# Patient Record
Sex: Female | Born: 1955 | Race: White | Hispanic: No
Health system: Midwestern US, Academic
[De-identification: ages and names within clinical notes are randomized; demographics above are authoritative.]

## PROBLEM LIST (undated history)

## (undated) DIAGNOSIS — Z1211 Encounter for screening for malignant neoplasm of colon: Secondary | ICD-10-CM

## (undated) DIAGNOSIS — Z1231 Encounter for screening mammogram for malignant neoplasm of breast: Secondary | ICD-10-CM

## (undated) DIAGNOSIS — Z87891 Personal history of nicotine dependence: Secondary | ICD-10-CM

## (undated) DIAGNOSIS — R928 Other abnormal and inconclusive findings on diagnostic imaging of breast: Secondary | ICD-10-CM

## (undated) DIAGNOSIS — E119 Type 2 diabetes mellitus without complications: Secondary | ICD-10-CM

## (undated) DIAGNOSIS — J449 Chronic obstructive pulmonary disease, unspecified: Secondary | ICD-10-CM

## (undated) DIAGNOSIS — C189 Malignant neoplasm of colon, unspecified: Secondary | ICD-10-CM

## (undated) DIAGNOSIS — A048 Other specified bacterial intestinal infections: Secondary | ICD-10-CM

## (undated) DIAGNOSIS — K2940 Chronic atrophic gastritis without bleeding: Secondary | ICD-10-CM

## (undated) DIAGNOSIS — M25559 Pain in unspecified hip: Secondary | ICD-10-CM

## (undated) MED ORDER — HYDROCODONE-ACETAMINOPHEN 5 MG-325 MG TAB
5-325 mg | ORAL_TABLET | ORAL | Status: DC | PRN
Start: ? — End: 2015-01-18

## (undated) MED ORDER — PEG 3350-ELECTROLYTES ORAL SOLUTION
Freq: Once | ORAL | Status: AC
Start: ? — End: 2012-12-22

## (undated) MED ORDER — CIPROFLOXACIN 500 MG TAB
500 mg | ORAL_TABLET | Freq: Two times a day (BID) | ORAL | Status: AC
Start: ? — End: 2013-01-02

## (undated) MED ORDER — BISACODYL-PEG-ELECTROLYTE SOLN 5 MG-210 GRAM ORAL KIT
5-210 mg-gram | PACK | Freq: Once | ORAL | Status: AC
Start: ? — End: 2012-12-20

## (undated) MED ORDER — DOCUSATE SODIUM 100 MG CAP: 100 mg | ORAL_CAPSULE | Freq: Two times a day (BID) | ORAL | Status: AC

---

## 2009-06-25 MED ORDER — DIAZEPAM 5 MG TAB
5 mg | ORAL_TABLET | Freq: Three times a day (TID) | ORAL | Status: DC | PRN
Start: 2009-06-25 — End: 2010-09-16

## 2009-06-25 MED ORDER — IBUPROFEN 600 MG TAB
600 mg | ORAL_TABLET | Freq: Four times a day (QID) | ORAL | Status: DC | PRN
Start: 2009-06-25 — End: 2010-09-16

## 2009-06-25 MED ORDER — HYDROCODONE-ACETAMINOPHEN 5 MG-500 MG TAB
5-500 mg | ORAL_TABLET | ORAL | Status: AC | PRN
Start: 2009-06-25 — End: 2009-07-02

## 2009-06-25 NOTE — ED Notes (Signed)
Patient noted back in room from xray with c-collar off.
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
## 2009-06-25 NOTE — ED Notes (Signed)
No head injury no loc. Received on backboard and collar
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
## 2009-10-15 NOTE — ED Notes (Signed)
Pt ambulatory to bathroom without problem.

## 2009-10-15 NOTE — ED Notes (Signed)
Pt presents via ems c/o Left lower arm "stiffness", intermittently for the past few months.  Pt states it happened once this morning, but denies pain or arm stiffness at this time.  Pt has full ROM noted to her L arm, skin is warm, dry and intact, brisk cap refill, bounding radial pulses.

## 2009-10-15 NOTE — ED Notes (Signed)
Assumed care of pt. Pt positioned for comfort, call bell given.  Plan of care discussed with pt.

## 2009-10-15 NOTE — ED Notes (Signed)
Pt is calling her son to come get her.

## 2010-09-16 LAB — CBC WITH AUTOMATED DIFF
ABS. BASOPHILS: 0.1 10*3/uL (ref 0.0–0.1)
ABS. EOSINOPHILS: 0.1 10*3/uL (ref 0.0–0.4)
ABS. LYMPHOCYTES: 2 10*3/uL (ref 0.8–3.5)
ABS. MONOCYTES: 0.9 10*3/uL (ref 0.0–1.0)
ABS. NEUTROPHILS: 8 10*3/uL (ref 1.8–8.0)
BASOPHILS: 1 % (ref 0–1)
EOSINOPHILS: 1 % (ref 0–7)
HCT: 38.4 % (ref 35.0–47.0)
HGB: 12.6 g/dL (ref 11.5–16.0)
LYMPHOCYTES: 18 % (ref 12–49)
MCH: 30.1 PG (ref 26.0–34.0)
MCHC: 32.8 g/dL (ref 30.0–36.5)
MCV: 91.9 FL (ref 80.0–99.0)
MONOCYTES: 8 % (ref 5–13)
NEUTROPHILS: 72 % (ref 32–75)
PLATELET: 243 10*3/uL (ref 150–400)
RBC: 4.18 M/uL (ref 3.80–5.20)
RDW: 12.6 % (ref 11.5–14.5)
WBC: 11.1 10*3/uL — ABNORMAL HIGH (ref 3.6–11.0)

## 2010-09-16 LAB — METABOLIC PANEL, COMPREHENSIVE
A-G Ratio: 0.9 — ABNORMAL LOW (ref 1.1–2.2)
ALT (SGPT): 42 U/L (ref 12–78)
AST (SGOT): 20 U/L (ref 15–37)
Albumin: 3.4 g/dL — ABNORMAL LOW (ref 3.5–5.0)
Alk. phosphatase: 99 U/L (ref 50–136)
Anion gap: 3 mmol/L — ABNORMAL LOW (ref 5–15)
BUN/Creatinine ratio: 13 (ref 12–20)
BUN: 8 MG/DL (ref 6–20)
Bilirubin, total: 0.2 MG/DL (ref 0.2–1.0)
CO2: 35 MMOL/L — ABNORMAL HIGH (ref 21–32)
Calcium: 9.6 MG/DL (ref 8.5–10.1)
Chloride: 100 MMOL/L (ref 97–108)
Creatinine: 0.6 MG/DL (ref 0.6–1.3)
GFR est AA: >60 mL/min/{1.73_m2} (ref >60)
GFR est non-AA: >60 mL/min/{1.73_m2} (ref >60)
Globulin: 3.9 g/dL (ref 2.0–4.0)
Glucose: 102 MG/DL — ABNORMAL HIGH (ref 65–100)
Potassium: 3.6 MMOL/L (ref 3.5–5.1)
Protein, total: 7.3 g/dL (ref 6.4–8.2)
Sodium: 138 MMOL/L (ref 136–145)

## 2010-09-16 LAB — EKG, 12 LEAD, INITIAL
Atrial Rate: 62 {beats}/min
Calculated P Axis: 82 degrees
Calculated R Axis: -56 degrees
Calculated T Axis: 47 degrees
Diagnosis: NORMAL
P-R Interval: 142 ms
Q-T Interval: 436 ms
QRS Duration: 76 ms
QTC Calculation (Bezet): 442 ms
Ventricular Rate: 62 {beats}/min

## 2010-09-16 LAB — TROPONIN I: Troponin-I, Qt.: <0.04 ng/mL (ref <0.05)

## 2010-09-16 LAB — CK W/ CKMB & INDEX
CK - MB: 4.7 NG/ML — ABNORMAL HIGH (ref 0.5–3.6)
CK-MB Index: 4.8 — ABNORMAL HIGH (ref 0–2.5)
CK: 98 U/L (ref 26–192)

## 2010-09-16 LAB — NT-PRO BNP: NT pro-BNP: 115 PG/ML (ref 0–125)

## 2010-09-16 MED ORDER — IPRATROPIUM BROMIDE 0.02 % SOLN FOR INHALATION
0.02 % | RESPIRATORY_TRACT | Status: AC
Start: 2010-09-16 — End: 2010-09-16
  Administered 2010-09-16: 19:00:00 via RESPIRATORY_TRACT

## 2010-09-16 MED ORDER — ALBUTEROL SULFATE 0.083 % (0.83 MG/ML) SOLN FOR INHALATION
2.5 mg /3 mL (0.083 %) | RESPIRATORY_TRACT | Status: AC
Start: 2010-09-16 — End: 2010-09-16
  Administered 2010-09-16: 20:00:00 via RESPIRATORY_TRACT

## 2010-09-16 MED ORDER — AZITHROMYCIN 250 MG TAB
250 mg | PACK | ORAL | Status: AC
Start: 2010-09-16 — End: 2010-09-21

## 2010-09-16 MED ORDER — ALBUTEROL 90 MCG/ACTUATION AEROSOL INHALER
90 mcg/actuation | RESPIRATORY_TRACT | Status: DC | PRN
Start: 2010-09-16 — End: 2012-08-25

## 2010-09-16 MED ORDER — ALBUTEROL SULFATE 0.083 % (0.83 MG/ML) SOLN FOR INHALATION
2.5 mg /3 mL (0.083 %) | RESPIRATORY_TRACT | Status: AC
Start: 2010-09-16 — End: 2010-09-16
  Administered 2010-09-16: 19:00:00 via RESPIRATORY_TRACT

## 2010-09-16 NOTE — ED Notes (Signed)
Pt lives independently  with her son. Was at a group center for daily activities when her left hand became numb, approx 2 hours ago.  Good strength bilaterally, no neuro deficits noted.   Productive cough with +wheezing for 1 month.

## 2010-09-16 NOTE — ED Provider Notes (Signed)
HPI Comments: 55 y.o. WF, with PMHx significant for schizophrenia, arrives via EMS to the ED with C/O L hand numbness and dry cough. Pt lives independently with her son and goes to a group center daily. Pt reports L hand numbness and tingling, while she was at her group center today PTA. Her caregivers at the group center thought the pt might have had a possible stroke. Pt also reports non-productive cough with associated wheezing x "couple months". Pt states her cough is worse at night. Pt notes that she has taken cough medication with some relief. Pt also reports generalized HA. Pt denies CP, N/V/D, shoulder pain, elbow pain, or change in her appetite. Pt notes she had a cardiac stent placed approximately 5 years ago. Pt denies PMHx significant for DM or asthma.     ZOX:WRUE P KOWALSKI, MD    Allergies: NKDA  PMHx significant for: HTN, CAD     There are no other complaints, changes or physical findings at this time.    Written by Talmadge Coventry. Pandak, ED Scribe, as dictated by PA-C Drinda Butts.     The history is provided by the patient.        Past Medical History   Diagnosis Date   ??? Hypertension    ??? CAD (coronary artery disease)    ??? Psychiatric disorder      schizophrenia        Past Surgical History   Procedure Date   ??? Cardiac surg procedure unlist          No family history on file.     History     Social History   ??? Marital Status: Single     Spouse Name: N/A     Number of Children: N/A   ??? Years of Education: N/A     Occupational History   ??? Not on file.     Social History Main Topics   ??? Smoking status: Passive Smoker   ??? Smokeless tobacco: Not on file   ??? Alcohol Use: No   ??? Drug Use: No   ??? Sexually Active: No     Other Topics Concern   ??? Not on file     Social History Narrative   ??? No narrative on file                  ALLERGIES: Review of patient's allergies indicates no known allergies.      Review of Systems   Constitutional: Negative.  Negative for appetite change.   Eyes: Negative.     Respiratory: Positive for cough and wheezing.    Cardiovascular: Negative.  Negative for chest pain.   Gastrointestinal: Negative.  Negative for nausea, vomiting and diarrhea.   Genitourinary: Negative.    Musculoskeletal: Negative.  Negative for arthralgias.   Skin: Negative.    Neurological: Positive for numbness (L hand) and headaches.   [all other systems reviewed and are negative        Filed Vitals:    09/16/10 1318 09/16/10 1420 09/16/10 1447 09/16/10 1456   BP: 135/102 132/73 132/73 125/72   Pulse: 67 62 63 64   Temp: 98.7 ??F (37.1 ??C)      Resp: 18 16  18    Height: 5\' 4"  (1.626 m)      Weight: 170 lb (77.111 kg)      SpO2: 96% 93%  94%            Physical Exam   [nursing notereviewed.  Constitutional: She is oriented to person, place, and time. She appears well-developed and well-nourished.   HENT:   Head: Normocephalic and atraumatic.   Right Ear: External ear normal.   Left Ear: External ear normal.   Nose: Nose normal.   Mouth/Throat: Oropharynx is clear and moist.   Eyes: Conjunctivae and EOM are normal. Pupils are equal, round, and reactive to light.   Neck: Normal range of motion. Neck supple.   Cardiovascular: Normal rate, regular rhythm and normal heart sounds.  Exam reveals no gallop and no friction rub.    No murmur heard.  Pulmonary/Chest: Effort normal. No respiratory distress. She has wheezes. She has no rales. She exhibits no tenderness.   Abdominal: Soft. Bowel sounds are normal. There is no tenderness. There is no rebound and no guarding.   Musculoskeletal: Normal range of motion. She exhibits no edema and no tenderness.   Neurological: She is alert and oriented to person, place, and time. She has normal reflexes. No cranial nerve deficit. Coordination normal.        Grips equal.  Upper and lower extremity strength intact.    Skin: Skin is warm and dry.   Psychiatric:        Slightly odd affect.   Behavior appropriate.  Answers questions and cooperates with exam.          MDM      Amount and/or Complexity of Data Reviewed:   Clinical lab tests:  [ordered and reviewed  Tests in the radiology section of CPT??:  [reviewed and ordered   Decide to obtain previous medical records or to obtain history from someone other than the patient:  No   Review and summarize past medical records:  Yes  Progress:   Patient progress:  Mason Jim      Procedures    4:05 PM  Pt has been reexamined by PA-C Drinda Butts. Pt has no new complaints, changes, or physical findings. Care plan outlined and precautions discussed. All available results were reviewed with pt. All medications were reviewed with pt. All of pt???s questions and concerns were addressed. Pt agrees to F/U as instructed by PA-C Drinda Butts and agrees to return to ED upon further deterioration. Pt is ready to go home.  Written by Talmadge Coventry. Pandak, ED Scribe, as dictated by .PA-C Drinda Butts.     ED EKG interpretation:  Rhythm: normal sinus rhythm; and regular . Rate (approx.): 62; Axis: left axis deviation; P wave: normal; QRS interval: normal ; ST/T wave: non-specific changes;  This EKG was interpreted by Nicki Reaper, MD,ED Provider.      I personally saw and examined the patient.  I have reviewed and agree with the MLP's findings, including all diagnostic interpretations, and plans as written.   I was present during the key portions of separately billed procedures.    Nicki Reaper, MD

## 2010-09-16 NOTE — Progress Notes (Signed)
MD at bedside for discharge home, pt with visitors verbalized understanding of need for f/u. Pt ambulatory with steady gait. NAD.

## 2011-06-23 LAB — CBC WITH AUTOMATED DIFF
ABS. BASOPHILS: 0 10*3/uL (ref 0.0–0.1)
ABS. EOSINOPHILS: 0.1 10*3/uL (ref 0.0–0.4)
ABS. LYMPHOCYTES: 1.5 10*3/uL (ref 0.8–3.5)
ABS. MONOCYTES: 0.5 10*3/uL (ref 0.0–1.0)
ABS. NEUTROPHILS: 6.3 10*3/uL (ref 1.8–8.0)
BASOPHILS: 0 % (ref 0–1)
EOSINOPHILS: 1 % (ref 0–7)
HCT: 41.9 % (ref 35.0–47.0)
HGB: 13.9 g/dL (ref 11.5–16.0)
LYMPHOCYTES: 18 % (ref 12–49)
MCH: 29.8 PG (ref 26.0–34.0)
MCHC: 33.2 g/dL (ref 30.0–36.5)
MCV: 89.7 FL (ref 80.0–99.0)
MONOCYTES: 6 % (ref 5–13)
NEUTROPHILS: 75 % (ref 32–75)
PLATELET: 262 10*3/uL (ref 150–400)
RBC: 4.67 M/uL (ref 3.80–5.20)
RDW: 12.6 % (ref 11.5–14.5)
WBC: 8.4 10*3/uL (ref 3.6–11.0)

## 2011-06-23 LAB — METABOLIC PANEL, COMPREHENSIVE
A-G Ratio: 1.2 (ref 1.1–2.2)
ALT (SGPT): 23 U/L (ref 12–78)
AST (SGOT): 19 U/L (ref 15–37)
Albumin: 3.9 g/dL (ref 3.5–5.0)
Alk. phosphatase: 59 U/L (ref 50–136)
Anion gap: 2 mmol/L — ABNORMAL LOW (ref 5–15)
BUN/Creatinine ratio: 30 — ABNORMAL HIGH (ref 12–20)
BUN: 14 MG/DL (ref 6–20)
Bilirubin, total: 0.3 MG/DL (ref 0.2–1.0)
CO2: 37 MMOL/L — ABNORMAL HIGH (ref 21–32)
Calcium: 9.1 MG/DL (ref 8.5–10.1)
Chloride: 99 MMOL/L (ref 97–108)
Creatinine: 0.46 MG/DL (ref 0.45–1.15)
GFR est AA: >60 mL/min/{1.73_m2} (ref >60)
GFR est non-AA: >60 mL/min/{1.73_m2} (ref >60)
Globulin: 3.2 g/dL (ref 2.0–4.0)
Glucose: 86 MG/DL (ref 65–100)
Potassium: 4.1 MMOL/L (ref 3.5–5.1)
Protein, total: 7.1 g/dL (ref 6.4–8.2)
Sodium: 138 MMOL/L (ref 136–145)

## 2011-06-23 LAB — EKG, 12 LEAD, INITIAL
Atrial Rate: 53 {beats}/min
Calculated P Axis: 73 degrees
Calculated R Axis: -60 degrees
Calculated T Axis: 69 degrees
P-R Interval: 152 ms
Q-T Interval: 456 ms
QRS Duration: 76 ms
QTC Calculation (Bezet): 427 ms
Ventricular Rate: 53 {beats}/min

## 2011-06-23 LAB — CK W/ CKMB & INDEX
CK - MB: 2.4 NG/ML (ref 0.5–3.6)
CK - MB: 2.5 NG/ML (ref 0.5–3.6)
CK-MB Index: 3.5 — ABNORMAL HIGH (ref 0–2.5)
CK-MB Index: 4 — ABNORMAL HIGH (ref 0–2.5)
CK: 63 U/L (ref 26–192)
CK: 69 U/L (ref 26–192)

## 2011-06-23 LAB — TROPONIN I
Troponin-I, Qt.: <0.04 ng/mL (ref <0.05)
Troponin-I, Qt.: <0.04 ng/mL (ref <0.05)

## 2011-06-23 NOTE — ED Notes (Signed)
Diet pepsi given to pt. Will redraw cardiac labs. Pt sleeps off and on. Pt aware at this time of having to recheck labs

## 2011-06-23 NOTE — ED Provider Notes (Signed)
HPI Comments: 56 y.o. female presents via EMS to ED C/O R sided CP x 1230 today. Pt describes her CP as a dull ache, which she states is non-radiating. Pt states her pain resolved 5 minutes PTA, after taking a 325 mg ASA en route. Pt reports a Hx of an MI, after which she had stents placed 6 years ago. Pt denies any CP with exertion recently, or exacerbating factors which worsen or improve her Sx's. Pt denies any SOB, ABD pain, N/V/D, F/C, cough or rash.    PCP: Evalee Jefferson, MD  PMHx significant for: HTN, CAD, asthma  PSHx significant for: cardiac cath with stent placement  Social Hx: + tobacco     There are no other complaints, changes or physical findings at this time. 3:07 PM  Written by Ena Dawley, ED Scribe; Dictated by Tessie Fass, MD.    The history is provided by the patient.        Past Medical History   Diagnosis Date   ??? Hypertension    ??? CAD (coronary artery disease)    ??? Psychiatric disorder      schizophrenia        Past Surgical History   Procedure Date   ??? Pr cardiac surg procedure unlist          No family history on file.     History     Social History   ??? Marital Status: SINGLE     Spouse Name: N/A     Number of Children: N/A   ??? Years of Education: N/A     Occupational History   ??? Not on file.     Social History Main Topics   ??? Smoking status: Passive Smoker   ??? Smokeless tobacco: Not on file   ??? Alcohol Use: No   ??? Drug Use: No   ??? Sexually Active: No     Other Topics Concern   ??? Not on file     Social History Narrative   ??? No narrative on file                  ALLERGIES: Review of patient's allergies indicates no known allergies.      Review of Systems   Constitutional: Negative.  Negative for fever and chills.   HENT: Negative.    Eyes: Negative.    Respiratory: Negative.  Negative for cough and shortness of breath.    Cardiovascular: Positive for chest pain.   Gastrointestinal: Negative.  Negative for nausea, vomiting, abdominal pain and diarrhea.   Genitourinary: Negative.     Musculoskeletal: Negative.    Skin: Negative.  Negative for rash.   Neurological: Negative.    All other systems reviewed and are negative.        Filed Vitals:    06/23/11 1400 06/23/11 1415 06/23/11 1430 06/23/11 1445   BP: 130/66 129/107 127/60 142/84   Pulse: 59 58 57 53   Temp: 98.1 ??F (36.7 ??C)      Resp: 16      Height: 5\' 4"  (1.626 m)      Weight: 77.111 kg (170 lb)      SpO2: 95% 96% 96% 95%            Physical Exam   Constitutional: She is oriented to person, place, and time. She appears well-developed and well-nourished. No distress.   HENT:   Head: Normocephalic and atraumatic.   Mouth/Throat: Oropharynx is clear and moist.  Eyes: Conjunctivae and EOM are normal. Pupils are equal, round, and reactive to light.   Neck: Normal range of motion. Neck supple.   Cardiovascular: Normal rate, regular rhythm and normal heart sounds.  Exam reveals no gallop and no friction rub.    No murmur heard.  Pulmonary/Chest: Effort normal. No respiratory distress. She has wheezes (Bilateral expiratory wheezes). She has no rales.   Abdominal: Soft. She exhibits no distension and no pulsatile midline mass. There is no tenderness. There is no rebound and no guarding.   Musculoskeletal: Normal range of motion. She exhibits edema (trace). She exhibits no tenderness.   Neurological: She is alert and oriented to person, place, and time.   Skin: Skin is warm, dry and intact.   Psychiatric: She has a normal mood and affect. Her behavior is normal.    Written by Ena Dawley, ED Scribe; Dictated by Tessie Fass, MD.    MDM     Amount and/or Complexity of Data Reviewed:   Clinical lab tests:  Ordered and reviewed  Tests in the radiology section of CPT??:  Ordered and reviewed  Tests in the medicine section of the CPT??:  Ordered and reviewed   Decide to obtain previous medical records or to obtain history from someone other than the patient:  Yes (EMS upon arrival)   Obtain history from someone other than the patient:  Yes    Review and summarize past medical records:  Yes   Discuss the patient with another provider:  Yes (EMS)   Independant visualization of image, tracing, or specimen:  Yes (EKG)  Progress:   Patient progress:  Stable      Procedures    3:48 PM  Discussed pt's hx, disposition, and available diagnostic and imaging results with Dr. Roxan Hockey. Reviewed care plans. Agrees with plans as outlined. Care of pt transferred to Dr. Roxan Hockey.  Written by Ena Dawley, ED Scribe, as dictated by Tessie Fass, MD.    LABORATORY TESTS:  Labs Reviewed   METABOLIC PANEL, COMPREHENSIVE - Abnormal; Notable for the following:     CO2 37 (*)      Anion gap 2 (*)      BUN/Creatinine ratio 30 (*)      All other components within normal limits   CK W/ CKMB & INDEX - Abnormal; Notable for the following:     CK-MB Index 3.5 (*)      All other components within normal limits   CK W/ CKMB & INDEX - Abnormal; Notable for the following:     CK-MB Index 4.0 (*)      All other components within normal limits   CBC WITH AUTOMATED DIFF   TROPONIN I   SAMPLES BEING HELD   TROPONIN I       IMAGING RESULTS:    XR CHEST PA LAT (Final result)   Result time:06/23/11 1509      Final result by Rad Results In Edi (06/23/11 15:09:00)      Narrative:    **Final Report**      ICD Codes / Adm.Diagnosis: 161096 / Chest Pain   Examination: CR CHEST PA AND LATERAL - [id number] - Jun 23 2011 3:09PM  Accession No: [id number]  Reason: cp      REPORT:  2 view chest    INDICATION: Chest pain.    COMPARISON: Sep 16, 2010.    FINDINGS: PA and lateral radiographs of the chest demonstrate clear lungs.   The cardiac and mediastinal  contours and pulmonary vascularity are normal.   The bones and soft tissues are within normal limits.      IMPRESSION: No acute process, unchanged              Signing/Reading Doctor: Julieanne Cotton [phone])   Approved: Julieanne Cotton [phone]) Jun 23 2011 3:12PM          MEDICATIONS GIVEN:  Medications - No data to display    IMPRESSION:  1. Chest  pain        PLAN:  1. D/c home F/u with cardiology as out-patient per Dr. Ihor Austin plan  Return to ED if worse         7:37 PM   PROGRESS NOTE:   Pt has been re-examined.  All diagnostic results have been reviewed and discussed with pt.  Care plan has been outlined and pt understands all current sx, dx, tx, and rx.  There are no new complaints, changes, or physical findings at this time.  All questions have been addressed. Pt was instructed to and agrees to follow up with Cardiology, as well as return to ED upon further deterioration.  Vernelle Wisner is ready for discharge.  Written by Perrin Maltese. Rebman, ED Scribe, as dictated by Tessie Fass, MD;Au*.

## 2011-06-23 NOTE — ED Notes (Signed)
Pt presents with complaint of rt sided chest pain when at rest. Pt has hx of cardiac stent placement 6 yrs ago. Pt reports a productive cough with yellow sputum and a runny nose. Pt denies sob, diaphoresis, fever, and nausea.

## 2011-06-23 NOTE — ED Notes (Signed)
Pt states feeling better, to x-ray via wheelchair. Pt request water upon return

## 2011-06-23 NOTE — ED Notes (Signed)
Pt given discharge instructions by Dr. Robinson. Pt verbalized understanding. Pt ambulated out of the ER.

## 2012-07-08 ENCOUNTER — Encounter

## 2012-07-14 ENCOUNTER — Encounter

## 2012-07-15 ENCOUNTER — Encounter

## 2012-07-19 ENCOUNTER — Encounter

## 2012-08-25 NOTE — Other (Signed)
Dr Clydene Laming has reviewed the patients chart, including cardiac notes, and has cleared patient to proceed with colonoscopy pending EKG to be done at PAT visit on 08/27/12 is unchanged.

## 2012-08-27 LAB — EKG, 12 LEAD, INITIAL
Atrial Rate: 52 {beats}/min
Calculated P Axis: 81 degrees
Calculated R Axis: -60 degrees
Calculated T Axis: 57 degrees
P-R Interval: 152 ms
Q-T Interval: 460 ms
QRS Duration: 70 ms
QTC Calculation (Bezet): 427 ms
Ventricular Rate: 52 {beats}/min

## 2012-08-27 NOTE — Other (Signed)
Today's EKG,  & heartnotes from 06/25/2011, stress test from 07/01/11 reviewed by Dr. Gaynell Face: may proceed with sx.

## 2012-08-27 NOTE — Other (Signed)
[facility]  Ambulatory Surgery Unit  Pre-operative Instructions    Surgery Date  08/30/12            Time of Arrival 1:15pm    1. On the day of your surgery, please report to the Ambulatory Surgery Unit Registration Desk and sign in at your designated time. The Ambulatory Surgery Unit is located in [facility] on the [place] side of the hospital. Please have all of your health insurance cards and a photo ID.    2. You must have someone with you to drive you home.You should not drive a car for 24 hours following surgery.    3. Do not have anything to eat or drink (including water, gum, mints, coffee, juice) after midnight   08/30/12  .  This may not apply to medications prescribed by your physician.  Please note special instructions, if applicable.  If you are currently taking Plavix, Coumadin, or other blood-thinning agents, contact your surgeon for instructions.    4. We recommend you do not drink any alcoholic beverages for 24 hours before and after your surgery.    5. Stop Asprin and non-steroidal anti-inflammatory drugs (i.e. Advil, Aleve) as directed by your surgeon's office. Stop all vitamins and herbal supplements seven days prior to your surgery.    6. Wear comfortable clothes.  Wear glasses instead of contacts.  Do not bring any money or jewelry.  Do not wear make-up, particularly mascara, the morning of your surgery.  Do not wear nail polish, particularly if you are having foot /hand surgery.  Wear your hair loose or down, no ponytails, buns, bobby pins or clips.  All body piercings must be removed.  Please shower with an anti-bacterial soap (i.e. Dial or Safeguard) prior to surgery, and do not apply any lotions, powders or deodorants afterwards.    7. You should understand that if you do not follow these instructions your surgery may be cancelled.  If your physical condition changes (i.e. fever, cold or flu) please contact your surgeon as soon as possible.    8. It is important  that you be on time.  If a situation occurs where you may be late, or if you have any questions or problems please call [phone].    9. Your surgery time may be subject to change. You will receive a phone call the day prior to surgery to confirm your arrival time.    10. Pediatric patients: please bring a change of clothes, diapers, bottle/sippy cup, pacifier, etc.      Special Instructions:    MEDICATIONS TO TAKE THE MORNING OF SURGERY WITH A SIP OF WATER: none      I understand a pre-operative phone call will be made to verify my surgery time.  In the event that I am not available, I give permission for a message to be left on my answering service and/or with another person?      yes         ___________________      ___________________  ________4/18/14_  (Signature of Patient)          (Witness)                              (Date and Time)

## 2012-08-30 ENCOUNTER — Inpatient Hospital Stay: Payer: MEDICAID

## 2012-08-30 MED ADMIN — lidocaine (PF) (XYLOCAINE) 20 mg/mL (2 %) injection: INTRAVENOUS | @ 19:00:00 | NDC 63323020805

## 2012-08-30 MED ADMIN — propofol (DIPRIVAN) 10 mg/mL injection: INTRAVENOUS | @ 19:00:00 | NDC 00703285604

## 2012-08-30 MED ADMIN — lactated ringers infusion: INTRAVENOUS | @ 18:00:00 | NDC 00409795309

## 2012-08-30 MED ADMIN — glucagon (GLUCAGEN) injection: INTRAVENOUS | @ 19:00:00 | NDC 00169706515

## 2012-08-30 MED FILL — GLUCAGEN HYPOKIT 1 MG INJECTION: 1 mg | INTRAMUSCULAR | Qty: 1

## 2012-08-30 NOTE — H&P (Signed)
Pre-endoscopy H and P    The patient was seen and examined in the room.  The airway was assessed and documented.  The problem list, past medical history, and medications were reviewed.     There is no problem list on file for this patient.    History     Social History   ??? Marital Status: SINGLE     Spouse Name: N/A     Number of Children: N/A   ??? Years of Education: N/A     Occupational History   ??? Not on file.     Social History Main Topics   ??? Smoking status: Current Every Day Smoker -- 1.00 packs/day   ??? Smokeless tobacco: Not on file   ??? Alcohol Use: No   ??? Drug Use: No   ??? Sexually Active: No     Other Topics Concern   ??? Not on file     Social History Narrative   ??? No narrative on file     Past Medical History   Diagnosis Date   ??? Schizophrenia      Dr Hubert Azure on [address]   ??? CAD (coronary artery disease)      s/p stents placed in 2006 [facility]; followed by Dr Eugenie Filler   ??? MI (myocardial infarction) 2006     in [place]   ??? Arthritis      back and hip   ??? GERD (gastroesophageal reflux disease)    ??? Hypercholesteremia          Prior to Admission Medications   Outpatient Medications Last Dose Informant Patient Reported? Taking?   aspirin 81 mg tablet 08/24/2012  Yes Yes   Sig: Take 81 mg by mouth.     atorvastatin (LIPITOR) 40 mg tablet 08/29/2012 at Unknown  Yes Yes   Sig: Take  by mouth every morning. Indications: HYPERCHOLESTEROLEMIA   benztropine (COGENTIN) 0.5 mg tablet 08/29/2012 at Unknown  Yes Yes   Sig: Take 0.5 mg by mouth nightly.   cholecalciferol (VITAMIN D3) 1,000 unit tablet 08/29/2012 at Unknown  Yes Yes   Sig: Take  by mouth daily.   citalopram (CELEXA) 40 mg tablet 08/29/2012 at Unknown  Yes Yes   Sig: Take 40 mg by mouth every morning.   clopidogrel (PLAVIX) 75 mg tablet 08/25/2012  Yes Yes   Sig: Take 75 mg by mouth daily.   docusate sodium (COLACE) 100 mg capsule 08/29/2012 at Unknown  Yes Yes   Sig: Take 100 mg by mouth every morning.   ezetimibe (ZETIA) 10 mg tablet 08/29/2012 at Unknown  Yes  Yes   Sig: Take 10 mg by mouth. Indications: HYPERLIPIDEMIA   olanzapine (ZYPREXA) 15 mg tablet 08/29/2012 at Unknown  Yes Yes   Sig: Take 15 mg by mouth nightly. Indications: SCHIZOPHRENIA   pantoprazole (PROTONIX) 40 mg tablet 08/29/2012 at Unknown  Yes Yes   Sig: Take 40 mg by mouth every morning. Indications: GASTROESOPHAGEAL REFLUX   trazodone (DESYREL) 150 mg tablet 08/29/2012 at Unknown  Yes Yes   Sig: Take 150 mg by mouth nightly.      Facility-Administered Medications: None           The review of systems is:  Negative  for shortness of breath or chest pain      The heart, lungs, and mental status were satisfactory for the administration of deep sedation and for the procedure.      I discussed with the patient the objectives, risks,  consequences and alternatives to the procedure.      Mubashir A. Sherryll Burger, MD  08/30/2012  2:43 PM

## 2012-08-30 NOTE — Procedures (Signed)
[place] Office: [phone]      Esophagogastroduodenoscopy Procedure Note      Claudia [name]  06-01-1955  [id number]    Indication:  Abdominal pain, epigastric - 789.06     Operator: Mubashir A. Sherryll Burger, MD    Referring Provider:  Evalee Jefferson, MD    Sedation:  MAC anesthesia    Procedure Details:  After infomed consent was obtained for the procedure, with all risks and benefits of procedure explained the patient was taken to the endoscopy suite and placed in the left lateral decubitus position.  Following sequential administration of sedation as per above, the endoscope was inserted into the mouth and advanced under direct vision to second portion of the duodenum.  A careful inspection was made as the gastroscope was withdrawn, including a retroflexed view of the proximal stomach; findings and interventions are described below.      Findings:     Esophagus: The esophageal mucosa in the proximal and mid esophagus is normal. The squamo-columnar junction is at 40 cm. A small hiatl hernia is noted with 3 columns of 1 cm each c/w Barrett's esophagus at 39 cm. Biopsied.    Stomach: The gastric mucosa has moderate gastritis in the body: biopsies taken.    Duodenum: The bulb and post bulbar mucosa is normal in appearance. The duodenal folds are normal.     Therapies:  biopsy of esophagus  biopsy of stomach body, antrum    Specimen: - Specimens were collected as described and send to the laboratory.           Complications:   None were encountered during the procedure.    EBL:  None.          Recommendations:     -Continue acid suppression.,   -Follow symptoms.,   -GERD diet: avoid fried and fatty foods. peppermint, chocolate, alcohol, coffee, citrus fruits and juices, tomoato products; avoid lying down for 2 to 3 hours after eating.,   -No NSAIDS    Mubashir A. Sherryll Burger, MD  08/30/2012  3:39 PM

## 2012-08-30 NOTE — Other (Addendum)
Handoff Report from Operating Room to PACU    Report received from C. Wheeler,CRNA and Megan Salon regarding Claudia [name].      Surgeon(s):  Clarise Cruz, MD  Clarise Cruz, MD  And Procedure(s) (LRB):  COLONOSCOPY,EGD (N/A)  ESOPHAGOGASTRODUODENOSCOPY (EGD) (N/A)  ESOPHAGOGASTRODUODENAL (EGD) BIOPSY (N/A)  confirmed   with allergies discussed.    Anesthesia type, drugs, patient history, complications, estimated blood loss, vital signs, intake and output, and last pain medication, lines and temperature were reviewed.

## 2012-08-30 NOTE — Op Note (Signed)
Cecum time for 6 minutes starting @ 1502.

## 2012-08-30 NOTE — Anesthesia Pre-Procedure Evaluation (Addendum)
Anesthetic History   No history of anesthetic complications           Review of Systems / Medical History  Patient summary reviewed, nursing notes reviewed and pertinent labs reviewed    Pulmonary  Within defined limits               Neuro/Psych   Within defined limits           Cardiovascular            CAD    Exercise tolerance: <4 METS     GI/Hepatic/Renal     GERD: poorly controlled             Endo/Other        Arthritis     Other Findings            Physical Exam    Airway  Mallampati: II  TM Distance: 4 - 6 cm  Neck ROM: normal range of motion   Mouth opening: Normal     Cardiovascular    Rhythm: regular  Rate: normal         Dental    Dentition: Full lower dentures and Full upper dentures     Pulmonary  Breath sounds clear to auscultation               Abdominal  GI exam deferred       Other Findings            Anesthetic Plan    ASA: 3  Anesthesia type: general          Induction: Intravenous  Anesthetic plan and risks discussed with: Patient

## 2012-08-30 NOTE — Other (Signed)
Pt discharged via wheelchair, accompanied by RN.  Pt discharged awake and alert, respirations equal and unlabored, skin warm, dry, and intact.  Pt and family members' questions and concerns addressed prior to discharge.  Pt and caregiver informed and written on discharge sheet per Dr. Sherryll Burger to restart Plavix in 3 days and may restart ASA today.

## 2012-08-30 NOTE — Procedures (Signed)
Colonoscopy Procedure Note    Claudia [name]  03/17/1956  [id number]    Indications:    Abdominal pain, generalized - 789.07, Constipation - 564.0, Family history of coloretal adenoma  (screening only) - V18.5     Pre-operative Diagnosis: ABDOMINAL PAIN,CONTIPATION,FAMILY HX OF COLON POLYPS    Post-operative Diagnosis: Internal hemorrhoids DIVERTICULOSIS, COLON POLYPS      Operator: Mubashir A. Tajah Noguchi, MD    Referring Provider: JOHN P KOWALSKI, MD    Sedation:  MAC anesthesia        Procedure Details:    After informed consent was obtained with all risks and benefits of procedure explained and preoperative exam completed, the patient was taken to the endoscopy suite and placed in the left lateral decubitus position.  Upon sequential sedation as per above, a digital rectal exam was performed  And was normal.  The Olympus videocolonoscope  was inserted in the rectum and carefully advanced to the cecum, which was identified by the ileocecal valve and appendiceal orifice.  The quality of preparation was fair.  The colonoscope was slowly withdrawn with careful evaluation between folds. Retroflexion in the rectum was performed and was normal.    Findings:     ?? A total of 7 colon polyps noted: 2 in prox: ascending colon., 1 transverse, 3 in sigmoid and 1 in the rectum. All removed with hot snare. See sizes below.  ?? A ulcerated, depressed area is seen in the sigmoid colon (? mass vs diverticulitis) in an area of moderate diverticula. 2 cc [place] Ink tattoo for marking was used; cold forceps biopsies taken. Views are limited as colon goes into severe spasms despiteusing water, Simethicone and 0.5 ml of Glucagon IV.  ?? Moderate sigmoid/descending colon diverticula.  ?? Internal hemorrhoids          Therapies:  7 complete polypectomy were performed using hot snare  and the polyps were  retrieved    Specimen:  specimen #1, 5 mm in size, located in the ascending colon removed by snare cautery and retrieved for pathology  #2, 7 mm in  size, located in the ascending colon removed by snare cautery and retrieved for pathology  #3, 8 mm in size, located in the transverse colon removed by snare cautery and retrieved for pathology  #4, 5 mm in size, located in the sigmoid removed by snare cautery and retrieved for pathology  #5, 6 mm in size, located in the sigmoid removed by snare cautery and retrieved for pathology  #6, 6 mm in size, located in the sigmoid removed by snare cautery and retrieved for pathology  #7, 10 mm in size, located in the rectum removed by snare cautery and retrieved for pathology Specimens were collected as described above and sent to pathology.       Complications: None were encountered during the procedure.     EBL:  None.    Recommendations:     -Await pathology.  -High fiber diet.  -CT abdomen/pelvis/CEA  -Repeat colonoscopy in 3 months if biopsies and CT are not helpful.      Mubashir A. Bristal Steffy, MD  08/30/2012  3:31 PM

## 2012-08-30 NOTE — Anesthesia Post-Procedure Evaluation (Signed)
Post-Anesthesia Evaluation and Assessment    Patient: Claudia [name] MRN: [id number]  SSN: [id number]    Date of Birth: 09-14-1955  Age: 57 y.o.  Sex: female       Cardiovascular Function/Vital Signs  Visit Vitals   Item Reading   ??? BP 141/60   ??? Pulse 55   ??? Temp 36.4 ??C (97.5 ??F)   ??? Resp 13   ??? Ht 5\' 4"  (1.626 m)   ??? Wt 75.297 kg (166 lb)   ??? BMI 28.48 kg/m2   ??? SpO2 95%       Patient is status post MAC anesthesia for Procedure(s):  COLONOSCOPY,EGD  ESOPHAGOGASTRODUODENOSCOPY (EGD)  ESOPHAGOGASTRODUODENAL (EGD) BIOPSY  COLONOSCOPY WITH POLYPECTOMY  COLONOSCOPY WITH BIOPSY  INJECTION.    Nausea/Vomiting: None    Postoperative hydration reviewed and adequate.    Pain:  Pain Scale 1: Numeric (0 - 10) (08/30/12 1539)  Pain Intensity 1: 0 (08/30/12 1539)   Managed    Neurological Status:   Neuro (WDL): Within Defined Limits (08/30/12 1539)   At baseline    Mental Status and Level of Consciousness: Alert and oriented     Pulmonary Status:   O2 Device: Room air (08/30/12 1542)   Adequate oxygenation and airway patent    Complications related to anesthesia: None    Post-anesthesia assessment completed. No concerns    Signed By: Docia Chuck, MD     August 30, 2012            +Post-Anesthesia Evaluation and Assessment    Patient: Claudia [name] MRN: [id number]  SSN: [id number]   Date of Birth: 1955-12-10  Age: 57 y.o.  Sex: female      Cardiovascular Function/Vital Signs  BP 141/60   Pulse 55   Temp(Src) 36.4 ??C (97.5 ??F)   Resp 13   Ht 5\' 4"  (1.626 m)   Wt 75.297 kg (166 lb)   BMI 28.48 kg/m2   SpO2 95%    Patient is status post Procedure(s) with comments:  COLONOSCOPY,EGD  ESOPHAGOGASTRODUODENOSCOPY (EGD)  ESOPHAGOGASTRODUODENAL (EGD) BIOPSY  COLONOSCOPY WITH POLYPECTOMY  COLONOSCOPY WITH BIOPSY  INJECTION - INJECTED INTO SIGMOID MASS .    Nausea/Vomiting: Controlled.    Postoperative hydration reviewed and adequate.    Pain:  Pain Scale 1: Numeric (0 - 10) (08/30/12 1539)  Pain Intensity 1: 0 (08/30/12 1539)   Managed.     Neurological Status:   Neuro (WDL): Within Defined Limits (08/30/12 1539)   At baseline.    Mental Status and Level of Consciousness: Arousable.    Pulmonary Status:   O2 Device: Room air (08/30/12 1542)   Adequate oxygenation and airway patent.    Complications related to anesthesia: None    Post-anesthesia assessment completed. No concerns.    Signed By: Docia Chuck, MD    August 30, 2012

## 2012-08-30 NOTE — Procedures (Signed)
Colonoscopy Procedure Note    Claudia [name]  [birth year]  [id number]    Indications:    Abdominal pain, generalized - 789.07, Constipation - 564.0, Family history of coloretal adenoma  (screening only) - V18.5     Pre-operative Diagnosis: ABDOMINAL PAIN,CONTIPATION,FAMILY HX OF COLON POLYPS    Post-operative Diagnosis: Internal hemorrhoids DIVERTICULOSIS, COLON POLYPS      Operator: Mubashir A. Sherryll Burger, MD    Referring Provider: Evalee Jefferson, MD    Sedation:  MAC anesthesia        Procedure Details:    After informed consent was obtained with all risks and benefits of procedure explained and preoperative exam completed, the patient was taken to the endoscopy suite and placed in the left lateral decubitus position.  Upon sequential sedation as per above, a digital rectal exam was performed  And was normal.  The Olympus videocolonoscope  was inserted in the rectum and carefully advanced to the cecum, which was identified by the ileocecal valve and appendiceal orifice.  The quality of preparation was fair.  The colonoscope was slowly withdrawn with careful evaluation between folds. Retroflexion in the rectum was performed and was normal.    Findings:     ?? A total of 7 colon polyps noted: 2 in prox: ascending colon., 1 transverse, 3 in sigmoid and 1 in the rectum. All removed with hot snare. See sizes below.  ?? A ulcerated, depressed area is seen in the sigmoid colon (? mass vs diverticulitis) in an area of moderate diverticula. 2 cc [place] Ink tattoo for marking was used; cold forceps biopsies taken. Views are limited as colon goes into severe spasms despiteusing water, Simethicone and 0.5 ml of Glucagon IV.  ?? Moderate sigmoid/descending colon diverticula.  ?? Internal hemorrhoids          Therapies:  7 complete polypectomy were performed using hot snare  and the polyps were  retrieved    Specimen:  specimen #1, 5 mm in size, located in the ascending colon removed by snare cautery and retrieved for pathology  #2, 7 mm in  size, located in the ascending colon removed by snare cautery and retrieved for pathology  #3, 8 mm in size, located in the transverse colon removed by snare cautery and retrieved for pathology  #4, 5 mm in size, located in the sigmoid removed by snare cautery and retrieved for pathology  #5, 6 mm in size, located in the sigmoid removed by snare cautery and retrieved for pathology  #6, 6 mm in size, located in the sigmoid removed by snare cautery and retrieved for pathology  #7, 10 mm in size, located in the rectum removed by snare cautery and retrieved for pathology Specimens were collected as described above and sent to pathology.       Complications: None were encountered during the procedure.     EBL:  None.    Recommendations:     -Await pathology.  -High fiber diet.  -CT abdomen/pelvis/CEA  -Repeat colonoscopy in 3 months if biopsies and CT are not helpful.      Mubashir A. Sherryll Burger, MD  08/30/2012  3:31 PM

## 2012-08-30 NOTE — Op Note (Signed)
Cecum time for 6 minutes starting @ 1502.

## 2012-08-30 NOTE — Procedures (Signed)
Procedures  by Wyn Quaker, MD at 08/30/12 1538                Author: Wyn Quaker, MD  Service: --  Author Type: Physician       Filed: 08/30/12 1541  Date of Service: 08/30/12 1538  Status: Signed          Editor: Wyn Quaker, MD (Physician)            Pre-procedure Diagnoses        1. Pain, abdominal [789.00]                           Post-procedure Diagnoses        1. Gastritis [535.50]        2. 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 UPPER GI ENDOSCOPY,BIOPSY [16109 (CPT??)]                                   [place] Office: [phone]        Esophagogastroduodenoscopy Procedure Note         CLAIRESSA BOULET   02/27/1956   [id number]      Indication:  Abdominal pain, epigastric - 789.06       Operator: Mubashir A. Sherryll Burger, MD      Referring Provider:  Evalee Jefferson, MD      Sedation:  MAC anesthesia      Procedure Details:   After infomed consent was obtained for the procedure, with all risks and benefits of procedure explained the patient was taken to the endoscopy suite and placed in the left lateral decubitus position.  Following sequential administration of sedation as  per above, the endoscope was inserted into the mouth and advanced under direct vision to second portion of the duodenum.  A careful inspection was made as the gastroscope was withdrawn, including a retroflexed view of the proximal stomach; findings and  interventions are described below.        Findings:       Esophagus: The esophageal mucosa in the proximal and mid esophagus is normal. The squamo-columnar junction is at 40 cm. A small hiatl hernia is  noted with 3 columns of 1 cm each c/w Barrett's esophagus at 39 cm. Biopsied.      Stomach: The gastric mucosa has moderate gastritis in the body: biopsies taken.      Duodenum: The bulb and post bulbar mucosa is normal in appearance. The duodenal folds are normal.       Therapies:  biopsy of esophagus   biopsy of stomach body, antrum       Specimen: - Specimens were collected as described and send to the laboratory.             Complications:   None were encountered during the procedure.      EBL:  None.            Recommendations:       -Continue acid suppression.,    -Follow symptoms.,    -GERD diet: avoid fried and fatty foods. peppermint, chocolate, alcohol, coffee, citrus fruits and juices, tomoato products; avoid lying down for 2 to 3 hours after eating.,    -  No NSAIDS      Mubashir A. Sherryll Burger, MD   08/30/2012  3:39 PM

## 2012-08-31 MED FILL — DIPRIVAN 10 MG/ML INTRAVENOUS EMULSION: 10 mg/mL | INTRAVENOUS | Qty: 40

## 2012-08-31 MED FILL — LIDOCAINE (PF) 20 MG/ML (2 %) IJ SOLN: 20 mg/mL (2 %) | INTRAMUSCULAR | Qty: 5

## 2012-08-31 MED FILL — LACTATED RINGERS IV: INTRAVENOUS | Qty: 1000

## 2012-08-31 MED FILL — GLUCAGEN HYPOKIT 1 MG INJECTION: 1 mg | INTRAMUSCULAR | Qty: 1

## 2012-09-08 ENCOUNTER — Encounter

## 2012-10-08 LAB — CREATININE, POC
Creatinine (POC): 0.6 MG/DL (ref 0.6–1.3)
GFRAA, POC: >60 mL/min/{1.73_m2} (ref >60)
GFRNA, POC: >60 mL/min/{1.73_m2} (ref >60)

## 2012-10-08 MED ADMIN — ioversol (OPTIRAY) 350 mg iodine/mL contrast solution 100 mL: INTRAVENOUS | @ 12:00:00 | NDC 00019133311

## 2012-10-08 MED ADMIN — sodium chloride (NS) flush 10 mL: INTRAVENOUS | @ 12:00:00 | NDC 87701099893

## 2012-10-08 MED ADMIN — barium sulfate (READICAT) 2.1 % (w/v), 2.0 % (w/w) oral suspension 900 mL: ORAL | @ 12:00:00 | NDC 32909071503

## 2012-10-08 MED ADMIN — 0.9% sodium chloride infusion: INTRAVENOUS | @ 12:00:00 | NDC 00409798303

## 2012-10-08 MED FILL — READI-CAT 2  2.1 % (W/V), 2.0 % (W/W) ORAL SUSPENSION: 2.1 % (w/v), 2.0 % (w/w) | ORAL | Qty: 900

## 2012-10-08 MED FILL — SODIUM CHLORIDE 0.9 % IV: INTRAVENOUS | Qty: 1000

## 2012-10-08 MED FILL — OPTIRAY 350 MG IODINE/ML INTRAVENOUS SOLUTION: 350 mg iodine/mL | INTRAVENOUS | Qty: 100

## 2012-12-01 ENCOUNTER — Inpatient Hospital Stay: Payer: MEDICARE

## 2012-12-01 MED ADMIN — propofol (DIPRIVAN) 10 mg/mL injection: INTRAVENOUS | @ 14:00:00 | NDC 00703285604

## 2012-12-01 MED ADMIN — lidocaine (PF) (XYLOCAINE) 20 mg/mL (2 %) injection: INTRAVENOUS | @ 14:00:00 | NDC 63323020805

## 2012-12-01 MED ADMIN — 0.9% sodium chloride infusion: INTRAVENOUS | @ 13:00:00 | NDC 00409798348

## 2012-12-01 NOTE — Anesthesia Pre-Procedure Evaluation (Signed)
Anesthetic History   No history of anesthetic complications           Review of Systems / Medical History  Patient summary reviewed, nursing notes reviewed and pertinent labs reviewed    Pulmonary    COPD             Neuro/Psych         Psychiatric history     Cardiovascular            CAD    Exercise tolerance: <4 METS     GI/Hepatic/Renal     GERD: poorly controlled             Endo/Other        Arthritis     Other Findings            Physical Exam    Airway  Mallampati: II  TM Distance: 4 - 6 cm  Neck ROM: normal range of motion   Mouth opening: Normal     Cardiovascular    Rhythm: regular  Rate: normal         Dental    Dentition: Full lower dentures and Full upper dentures     Pulmonary  Breath sounds clear to auscultation               Abdominal  GI exam deferred       Other Findings            Anesthetic Plan    ASA: 3  Anesthesia type: general          Induction: Intravenous  Anesthetic plan and risks discussed with: Patient

## 2012-12-01 NOTE — H&P (Signed)
Pre-endoscopy H and P    The patient was seen and examined in the room/pre-op holding area.  The airway was assessed and documented.  The problem list, past medical history, and medications were reviewed.     There is no problem list on file for this patient.    History     Social History   ??? Marital Status: SINGLE     Spouse Name: N/A     Number of Children: N/A   ??? Years of Education: N/A     Occupational History   ??? Not on file.     Social History Main Topics   ??? Smoking status: Current Every Day Smoker -- 1.00 packs/day   ??? Smokeless tobacco: Not on file   ??? Alcohol Use: No   ??? Drug Use: No   ??? Sexually Active: No     Other Topics Concern   ??? Not on file     Social History Narrative   ??? No narrative on file     Past Medical History   Diagnosis Date   ??? Schizophrenia      Dr Hubert Azure on [address]   ??? MI (myocardial infarction) 2006     in [place]   ??? GERD (gastroesophageal reflux disease)    ??? Hypercholesteremia    ??? CAD (coronary artery disease)       stent placed in 2006 [place]; followed by Dr Eugenie Filler   ??? Chronic obstructive pulmonary disease    ??? Arthritis      unknown type - back   ??? Psychiatric disorder      schizophrenia         Prior to Admission Medications   Outpatient Medications Last Dose Informant Patient Reported? Taking?   aspirin 81 mg tablet 11/24/2012 at Unknown time  Yes Yes   Sig: Take 81 mg by mouth daily.   atorvastatin (LIPITOR) 40 mg tablet 11/30/2012 at Unknown time  Yes Yes   Sig: Take  by mouth every morning. Indications: HYPERCHOLESTEROLEMIA   benztropine (COGENTIN) 0.5 mg tablet 11/30/2012 at Unknown time  Yes Yes   Sig: Take 0.5 mg by mouth nightly.   cholecalciferol (VITAMIN D3) 1,000 unit tablet 11/30/2012 at Unknown time  Yes Yes   Sig: Take  by mouth daily.   citalopram (CELEXA) 40 mg tablet 11/30/2012 at Unknown time  Yes Yes   Sig: Take 40 mg by mouth every morning.   clopidogrel (PLAVIX) 75 mg tablet 11/25/2012  Yes Yes   Sig: Take 75 mg by mouth daily.   docusate sodium  (COLACE) 100 mg capsule 11/30/2012 at Unknown time  Yes Yes   Sig: Take 100 mg by mouth every morning.   ezetimibe (ZETIA) 10 mg tablet 11/30/2012 at Unknown time  Yes Yes   Sig: Take 10 mg by mouth daily. Indications: HYPERLIPIDEMIA   olanzapine (ZYPREXA) 15 mg tablet 11/30/2012 at Unknown time  Yes Yes   Sig: Take 15 mg by mouth nightly. Indications: SCHIZOPHRENIA   pantoprazole (PROTONIX) 40 mg tablet 11/30/2012 at Unknown time  Yes Yes   Sig: Take 40 mg by mouth every morning. Indications: GASTROESOPHAGEAL REFLUX   tiotropium (SPIRIVA WITH HANDIHALER) 18 mcg inhalation capsule 11/30/2012 at Unknown time  Yes Yes   Sig: Take 1 Cap by inhalation daily.   trazodone (DESYREL) 150 mg tablet 11/30/2012 at Unknown time  Yes Yes   Sig: Take 150 mg by mouth nightly.      Facility-Administered Medications: None  The review of systems is:  Negative  for shortness of breath or chest pain      The heart, lungs, and mental status were satisfactory for the administration of deep sedation and for the procedure.      I discussed with the patient the objectives, risks, consequences and alternatives to the procedure.      Mubashir A. Sherryll Burger, MD  12/01/2012  9:31 AM

## 2012-12-01 NOTE — Procedures (Signed)
Colonoscopy Procedure Note    Claudia [name]  06/03/1955  [id number]    Indications:    Personal history of colon polyps (screening only) - V12.72     Pre-operative Diagnosis: Hx of COLON POLYPs    Post-operative Diagnosis: diverticulosis, colon polyps, sigmoid ulcerated mass, hemorrhoids internal      Operator: Mubashir A. Lanna Labella, MD    Referring Provider: JOHN P KOWALSKI, MD    Sedation:  MAC anesthesia        Procedure Details:    After informed consent was obtained with all risks and benefits of procedure explained and preoperative exam completed, the patient was taken to the endoscopy suite and placed in the left lateral decubitus position.  Upon sequential sedation as per above, a digital rectal exam was performed  And was normal.  The Olympus videocolonoscope  was inserted in the rectum and carefully advanced to the cecum, which was identified by the ileocecal valve.  The quality of preparation was fair.  The colonoscope was slowly withdrawn with careful evaluation between folds. Retroflexion in the rectum was performed and was normal.    Findings:     ?? Again an ulcerated, suspicious looking mass with friable mucosa was seen in the sigmoid colon surrounded by dense diverticula. I took multiple biopsies and marked it  3 cc of submucosal [place] Ink injection.  ??  Scattered diverticula also seen in descending and transverse colon.  ?? A few sigmoid colon, small polyps were seen in the sigmoid colon: removed with a cold snare.  ?? Medium sized internal hemorrhoids  ?? Spasmic colon.      Therapies:  biopsy of colon sigmoid colon ulcerated mass with 3 cc on [place] Ink tatoo  Few complete polypectomies were performed using a snare  and the polyps were  retrieved    Specimen:   Specimens were collected as described above and sent to pathology.       Complications: None were encountered during the procedure.     EBL:  None.    Recommendations:     -Await pathology.  -Surgical referral. To me this looks very suspicious.     -F/u with me in clinic.    Mubashir A. Jeziah Kretschmer, MD  12/01/2012  9:58 AM

## 2012-12-01 NOTE — Progress Notes (Addendum)
Report received from anesthesia,pt. Tolerated procedure without complications.Profofol given by anesthesia.

## 2012-12-01 NOTE — Anesthesia Post-Procedure Evaluation (Signed)
Post-Anesthesia Evaluation and Assessment    Patient: Claudia [name] MRN: [id number]  SSN: [id number]    Date of Birth: [birth year]  Age: 57 y.o.  Sex: female       Cardiovascular Function/Vital Signs  Visit Vitals   Item Reading   ??? BP 152/63   ??? Pulse 56   ??? Resp 16   ??? Ht 5\' 4"  (1.626 m)   ??? Wt 72.122 kg (159 lb)   ??? BMI 27.28 kg/m2   ??? SpO2 95%   ??? Breastfeeding No       Patient is status post Con-Sed anesthesia for Procedure(s):  COLONOSCOPY  ENDOSCOPIC POLYPECTOMY  COLON BIOPSY  INJECTION.    Nausea/Vomiting: None    Postoperative hydration reviewed and adequate.    Pain:  Pain Scale 1: Numeric (0 - 10) (12/01/12 1024)  Pain Intensity 1: 0 (12/01/12 1024)   Managed    Neurological Status:       At baseline    Mental Status and Level of Consciousness: Alert and oriented     Pulmonary Status:   O2 Device: Room air (12/01/12 1024)   Adequate oxygenation and airway patent    Complications related to anesthesia: None    Post-anesthesia assessment completed. No concerns    Signed By: Baker Janus, DO     December 01, 2012

## 2012-12-01 NOTE — Other (Signed)
.  KARISA NESSER  1956/01/09  [id number]    Situation:  Verbal report received from:U.Effie Shy RN   Procedure: Procedure(s) with comments:  COLONOSCOPY  ENDOSCOPIC POLYPECTOMY  COLON BIOPSY  INJECTION - 3cc [place] ink injected    Background:    Preoperative diagnosis: BARRETTS,CAD,CONSTIPATION,GERD,FAMILY HX OF COLON POLYPS,INTERNAL HEMORRHOIDS,ABDOMINLA PAIN,COLON POLYP    Postoperative diagnosis: diverticulosis, colon polyps,sigmoid ulcerated mass    Operator:  Dr. Clarise Cruz, MD    Assistant(s): Endoscopy Technician-1: Hope E Bowen  Endoscopy RN-1: Anselm Pancoast, RN    Specimens: ID Type Source Tests Collected by Time Destination   1 : pathologu Preservative Sigmoid  Clarise Cruz, MD 12/01/2012 [phone] Pathology   2 : pathology Preservative Sigmoid  Clarise Cruz, MD 12/01/[id number] Pathology     H. Pylori  No    Assessment:  Intra-procedure medications, See Anesthesia Record             Intravenous fluids:   NS@ KVO     Vital signs stable      Abdominal assessment: round and soft      Recommendation:  Discharge patient per MD order .     Family or Friend, daughter in law is present with pt  Permission to share finding with family or friend , yes

## 2012-12-01 NOTE — Progress Notes (Signed)
Pt. Monitored by anesthesia,see anesthesia record.

## 2012-12-01 NOTE — Procedures (Signed)
Colonoscopy Procedure Note    ODIS WICKEY  Dec 15, 1955  [id number]    Indications:    Personal history of colon polyps (screening only) - V12.72     Pre-operative Diagnosis: Hx of COLON POLYPs    Post-operative Diagnosis: diverticulosis, colon polyps, sigmoid ulcerated mass, hemorrhoids internal      Operator: Mubashir A. Sherryll Burger, MD    Referring Provider: Evalee Jefferson, MD    Sedation:  MAC anesthesia        Procedure Details:    After informed consent was obtained with all risks and benefits of procedure explained and preoperative exam completed, the patient was taken to the endoscopy suite and placed in the left lateral decubitus position.  Upon sequential sedation as per above, a digital rectal exam was performed  And was normal.  The Olympus videocolonoscope  was inserted in the rectum and carefully advanced to the cecum, which was identified by the ileocecal valve.  The quality of preparation was fair.  The colonoscope was slowly withdrawn with careful evaluation between folds. Retroflexion in the rectum was performed and was normal.    Findings:     ?? Again an ulcerated, suspicious looking mass with friable mucosa was seen in the sigmoid colon surrounded by dense diverticula. I took multiple biopsies and marked it  3 cc of submucosal [place] Ink injection.  ??  Scattered diverticula also seen in descending and transverse colon.  ?? A few sigmoid colon, small polyps were seen in the sigmoid colon: removed with a cold snare.  ?? Medium sized internal hemorrhoids  ?? Spasmic colon.      Therapies:  biopsy of colon sigmoid colon ulcerated mass with 3 cc on [place] Ink tatoo  Few complete polypectomies were performed using a snare  and the polyps were  retrieved    Specimen:   Specimens were collected as described above and sent to pathology.       Complications: None were encountered during the procedure.     EBL:  None.    Recommendations:     -Await pathology.  -Surgical referral. To me this looks very suspicious.     -F/u with me in clinic.    Mubashir A. Sherryll Burger, MD  12/01/2012  9:58 AM

## 2012-12-02 MED FILL — DIPRIVAN 10 MG/ML INTRAVENOUS EMULSION: 10 mg/mL | INTRAVENOUS | Qty: 240

## 2012-12-02 MED FILL — LIDOCAINE (PF) 20 MG/ML (2 %) IJ SOLN: 20 mg/mL (2 %) | INTRAMUSCULAR | Qty: 40

## 2012-12-02 NOTE — Telephone Encounter (Signed)
Spoke with daughter of patient, she does all appointment scheduling for Claudia [name]. At her request appointment 12/17/12 at 3:20pm is confirmed for Claudia Hyacinth Meeker with Dr Nadyne Coombes.

## 2012-12-20 NOTE — Progress Notes (Signed)
Surgery Consult:  Colon cancer  Requesting physician:  Dr. Shah    Subjective:   Patient 57 y.o. Caucasian female presents with abnormal colonoscopy.  Patient has been having achy lower abdominal pain on and off for 6 months.  No obvious palliative or provocative factors.  Patient also with 30-40 lbs weight loss over the 2 months.  Patient also noted lower GI bleeding; usually sees drips in the toilet after BM.  Patient with history of constipation.  No F/C/S.  No nausea or vomiting.  Patient underwent colonoscopy on 08/30/12 and noted to multiple colonic polyps, diverticulosis, and ulcerated area in the sigmoid colon.  Biopsy returned tubular adenoma.  Patient underwent follow up colonoscopy on 12/01/12 and found large ulcerated sigmoid mass.  The sigmoid colon mass biopsy returned adenocarcinoma.  Patient subsequently underwent abd/pelvic CT scan on 10/08/12 which was unremarkable except for diverticulosis.  Currently denies any abdominal pain.  No CEA noted connect care.  Patient usually on aspirin and plavix, but has been holding since the colonoscopy.      Past Medical & Surgical History:  Past Medical History   Diagnosis Date   ??? Schizophrenia      Dr Liverman on [address]   ??? MI (myocardial infarction) 2006     in [place]   ??? GERD (gastroesophageal reflux disease)    ??? Hypercholesteremia    ??? CAD (coronary artery disease)       stent placed in 2006 [place]; followed by Dr Caven   ??? Chronic obstructive pulmonary disease    ??? Arthritis      unknown type - back   ??? Psychiatric disorder      schizophrenia      Past Surgical History   Procedure Laterality Date   ??? Pr cardiac surg procedure unlist  2006     in [place]       Social History:  History     Social History   ??? Marital Status: SINGLE     Spouse Name: N/A     Number of Children: N/A   ??? Years of Education: N/A     Occupational History   ??? Not on file.     Social History Main Topics   ??? Smoking status: Current Every Day Smoker -- 1.00 packs/day   ???  Smokeless tobacco: Not on file   ??? Alcohol Use: No   ??? Drug Use: No   ??? Sexually Active: No     Other Topics Concern   ??? Not on file     Social History Narrative   ??? No narrative on file        Family History:  Family History   Problem Relation Age of Onset   ??? Heart Attack Father         Medications:  Current Outpatient Prescriptions   Medication Sig   ??? bisacodyl-peg-electrolyte (HALFLYTELY-BISACODYL W-FLAV PK) 5-210 mg-gram kit kit Take 1 Kit by mouth once for 1 dose.   ??? tiotropium (SPIRIVA WITH HANDIHALER) 18 mcg inhalation capsule Take 1 Cap by inhalation daily.   ??? pantoprazole (PROTONIX) 40 mg tablet Take 40 mg by mouth every morning. Indications: GASTROESOPHAGEAL REFLUX   ??? docusate sodium (COLACE) 100 mg capsule Take 100 mg by mouth every morning.   ??? atorvastatin (LIPITOR) 40 mg tablet Take  by mouth every morning. Indications: HYPERCHOLESTEROLEMIA   ??? cholecalciferol (VITAMIN D3) 1,000 unit tablet Take  by mouth daily.   ??? aspirin 81 mg   tablet Take 81 mg by mouth daily.   ??? ezetimibe (ZETIA) 10 mg tablet Take 10 mg by mouth daily. Indications: HYPERLIPIDEMIA   ??? citalopram (CELEXA) 40 mg tablet Take 40 mg by mouth every morning.   ??? trazodone (DESYREL) 150 mg tablet Take 150 mg by mouth nightly.   ??? benztropine (COGENTIN) 0.5 mg tablet Take 0.5 mg by mouth nightly.   ??? clopidogrel (PLAVIX) 75 mg tablet Take 75 mg by mouth daily.   ??? olanzapine (ZYPREXA) 15 mg tablet Take 15 mg by mouth nightly. Indications: SCHIZOPHRENIA     No current facility-administered medications for this visit.       Allergies:  No Known Allergies    Review of Systems  A comprehensive review of systems was negative except for that written in the HPI.    Objective:     Exam:    BP 137/72   Pulse 77   Ht 5' 4" (1.626 m)   Wt 72.122 kg (159 lb)   BMI 27.28 kg/m2   SpO2 98%  General appearance: alert, cooperative, no distress, appears stated age  Eyes: negative  Lungs: clear to auscultation bilaterally  Heart: regular rate and rhythm   Abdomen: soft, non-tender. Non-distended.  Extremities: extremities normal, atraumatic, no cyanosis or edema.  ROMI.  Skin: Skin color, texture, turgor normal. No rashes or lesions.    Neurologic: Grossly normal      Data Review  Colonoscopy and CT scan reviewed.    Assessment:     Colon cancer    Plan:     Laparoscopic sigmoid colectomy 12/23/12.  Risks, benefit, and alternative to surgery was discussed with the patient.  The risks of surgery include but not limited to infection, bleeding, intraabdominal organ injury, anastomotic leak, possible conversion to open, and the risks of general anesthetic. Patient is agreeable to surgery.  All questions answered.  Continue to hold aspirin and plavix.  Bowel prep a day prior to surgery.  Check CEA

## 2012-12-20 NOTE — H&P (View-Only) (Signed)
Surgery Consult:  Colon cancer  Requesting physician:  Dr. Sherryll Burger    Subjective:   Patient 57 y.o. Caucasian female presents with abnormal colonoscopy.  Patient has been having achy lower abdominal pain on and off for 6 months.  No obvious palliative or provocative factors.  Patient also with 30-40 lbs weight loss over the 2 months.  Patient also noted lower GI bleeding; usually sees drips in the toilet after BM.  Patient with history of constipation.  No F/C/S.  No nausea or vomiting.  Patient underwent colonoscopy on 08/30/12 and noted to multiple colonic polyps, diverticulosis, and ulcerated area in the sigmoid colon.  Biopsy returned tubular adenoma.  Patient underwent follow up colonoscopy on 12/01/12 and found large ulcerated sigmoid mass.  The sigmoid colon mass biopsy returned adenocarcinoma.  Patient subsequently underwent abd/pelvic CT scan on 10/08/12 which was unremarkable except for diverticulosis.  Currently denies any abdominal pain.  No CEA noted connect care.  Patient usually on aspirin and plavix, but has been holding since the colonoscopy.      Past Medical & Surgical History:  Past Medical History   Diagnosis Date   ??? Schizophrenia      Dr Hubert Azure on [place] [place]   ??? MI (myocardial infarction) 2006     in [place]   ??? GERD (gastroesophageal reflux disease)    ??? Hypercholesteremia    ??? CAD (coronary artery disease)       stent placed in 2006 [place]; followed by Dr Eugenie Filler   ??? Chronic obstructive pulmonary disease    ??? Arthritis      unknown type - back   ??? Psychiatric disorder      schizophrenia      Past Surgical History   Procedure Laterality Date   ??? Pr cardiac surg procedure unlist  2006     in [place]       Social History:  History     Social History   ??? Marital Status: SINGLE     Spouse Name: N/A     Number of Children: N/A   ??? Years of Education: N/A     Occupational History   ??? Not on file.     Social History Main Topics   ??? Smoking status: Current Every Day Smoker -- 1.00 packs/day   ???  Smokeless tobacco: Not on file   ??? Alcohol Use: No   ??? Drug Use: No   ??? Sexually Active: No     Other Topics Concern   ??? Not on file     Social History Narrative   ??? No narrative on file        Family History:  Family History   Problem Relation Age of Onset   ??? Heart Attack Father         Medications:  Current Outpatient Prescriptions   Medication Sig   ??? bisacodyl-peg-electrolyte (HALFLYTELY-BISACODYL W-FLAV PK) 5-210 mg-gram kit kit Take 1 Kit by mouth once for 1 dose.   ??? tiotropium (SPIRIVA WITH HANDIHALER) 18 mcg inhalation capsule Take 1 Cap by inhalation daily.   ??? pantoprazole (PROTONIX) 40 mg tablet Take 40 mg by mouth every morning. Indications: GASTROESOPHAGEAL REFLUX   ??? docusate sodium (COLACE) 100 mg capsule Take 100 mg by mouth every morning.   ??? atorvastatin (LIPITOR) 40 mg tablet Take  by mouth every morning. Indications: HYPERCHOLESTEROLEMIA   ??? cholecalciferol (VITAMIN D3) 1,000 unit tablet Take  by mouth daily.   ??? aspirin 81 mg  tablet Take 81 mg by mouth daily.   ??? ezetimibe (ZETIA) 10 mg tablet Take 10 mg by mouth daily. Indications: HYPERLIPIDEMIA   ??? citalopram (CELEXA) 40 mg tablet Take 40 mg by mouth every morning.   ??? trazodone (DESYREL) 150 mg tablet Take 150 mg by mouth nightly.   ??? benztropine (COGENTIN) 0.5 mg tablet Take 0.5 mg by mouth nightly.   ??? clopidogrel (PLAVIX) 75 mg tablet Take 75 mg by mouth daily.   ??? olanzapine (ZYPREXA) 15 mg tablet Take 15 mg by mouth nightly. Indications: SCHIZOPHRENIA     No current facility-administered medications for this visit.       Allergies:  No Known Allergies    Review of Systems  A comprehensive review of systems was negative except for that written in the HPI.    Objective:     Exam:    BP 137/72   Pulse 77   Ht 5\' 4"  (1.626 m)   Wt 72.122 kg (159 lb)   BMI 27.28 kg/m2   SpO2 98%  General appearance: alert, cooperative, no distress, appears stated age  Eyes: negative  Lungs: clear to auscultation bilaterally  Heart: regular rate and rhythm   Abdomen: soft, non-tender. Non-distended.  Extremities: extremities normal, atraumatic, no cyanosis or edema.  ROMI.  Skin: Skin color, texture, turgor normal. No rashes or lesions.    Neurologic: Grossly normal      Data Review  Colonoscopy and CT scan reviewed.    Assessment:     Colon cancer    Plan:     Laparoscopic sigmoid colectomy 12/23/12.  Risks, benefit, and alternative to surgery was discussed with the patient.  The risks of surgery include but not limited to infection, bleeding, intraabdominal organ injury, anastomotic leak, possible conversion to open, and the risks of general anesthetic. Patient is agreeable to surgery.  All questions answered.  Continue to hold aspirin and plavix.  Bowel prep a day prior to surgery.  Check CEA

## 2012-12-22 LAB — METABOLIC PANEL, BASIC
Anion gap: 6 mmol/L (ref 5–15)
BUN/Creatinine ratio: 18 (ref 12–20)
BUN: 8 MG/DL (ref 6–20)
CO2: 32 mmol/L (ref 21–32)
Calcium: 9.3 MG/DL (ref 8.5–10.1)
Chloride: 102 mmol/L (ref 97–108)
Creatinine: 0.45 MG/DL (ref 0.45–1.15)
GFR est AA: >60 mL/min/{1.73_m2} (ref >60)
GFR est non-AA: >60 mL/min/{1.73_m2} (ref >60)
Glucose: 77 mg/dL (ref 65–100)
Potassium: 4 mmol/L (ref 3.5–5.1)
Sodium: 140 mmol/L (ref 136–145)

## 2012-12-22 LAB — URINALYSIS W/ REFLEX CULTURE
Bilirubin: NEGATIVE
Blood: NEGATIVE
Glucose: NEGATIVE mg/dL
Ketone: NEGATIVE mg/dL
Nitrites: NEGATIVE
Protein: NEGATIVE mg/dL
Specific gravity: <1.005 (ref 1.003–1.030)
Urobilinogen: 0.2 EU/dL (ref 0.2–1.0)
pH (UA): 6 (ref 5.0–8.0)

## 2012-12-22 LAB — CBC W/O DIFF
HCT: 42.7 % (ref 35.0–47.0)
HGB: 14 g/dL (ref 11.5–16.0)
MCH: 29.7 PG (ref 26.0–34.0)
MCHC: 32.8 g/dL (ref 30.0–36.5)
MCV: 90.7 FL (ref 80.0–99.0)
PLATELET: 243 10*3/uL (ref 150–400)
RBC: 4.71 M/uL (ref 3.80–5.20)
RDW: 12.6 % (ref 11.5–14.5)
WBC: 7.5 10*3/uL (ref 3.6–11.0)

## 2012-12-22 LAB — CEA: CEA: 1.5 ng/mL

## 2012-12-22 NOTE — Other (Signed)
[facility]  Preoperative Instructions        Surgery Date 12/23/12          Time of Arrival 1030    1. On the day of your surgery, please report to the Surgical Services Registration Desk and sign in at your designated time. The Surgery Center is located to the right of the Emergency Room.     2. You must have someone with you to drive you home. You should not drive a car for 24 hours following surgery. Please make arrangements for a friend or family member to stay with you for the first 24 hours after your surgery.    3. Do not have anything to eat or drink (including water, gum, mints, coffee, juice) after midnight 12/22/12              . This may not apply to medications prescribed by your physician.  Please note special instructions, if applicable.  If you are currently taking Plavix, Coumadin, or other blood-thinning agents, contact your surgeon for instructions.    4. We recommend you do not drink any alcoholic beverages for 24 hours before and after your surgery.    5. Have a list of all current medications, including vitamins, herbal supplements and any other over the counter medications. Stop all Aspirin and non-steroidal anti-inflammatory drugs (I.e. Advil, Aleve), as directed by your surgeon's office. Stop all vitamins and herbal supplements seven days prior to your surgery.    6. Wear comfortable clothes.  Wear glasses instead of contacts.  Do not bring any money or jewelry. Please bring picture ID, insurance card, and any prearranged co-payment or hospital payment.  Do not wear make-up, particularly mascara the morning of your surgery.  Do not wear nail polish, particularly if you are having foot /hand surgery.  Wear your hair loose or down, no ponytails, buns, bobby pins or clips.  All body piercings must be removed.  Please shower the night before or morning of surgery, but do not apply any lotions, powders or deodorants afterwards. Please sleep in clean clothes and change  bed linens the night before surgery.  Please do not shave for 48 hours prior to surgery. Shaving of the face is acceptable.    7. You should understand that if you do not follow these instructions your surgery may be cancelled.  If your physical condition changes (I.e. fever, cold or flu) please contact your surgeon as soon as possible.    8. It is important that you be on time.  If a situation occurs where you may be late, please call [phone] (OR Holding Area).    9. If you have any questions and or problems, please call [phone] (Pre-admission Testing).    10. Your surgery time may be subject to change.  You will receive a phone call the evening prior if your time changes.    11.  If having outpatient surgery, you must have someone to drive you here, stay with you during the duration of your stay, and to drive you home at time of discharge.        Special Instructions:    MEDICATIONS TO TAKE THE MORNING OF SURGERY WITH A SIP OF WATER: pantoprazole(protonix)      I understand a pre-operative phone call will be made to verify my surgery time.  In the event that I am not available, I give permission for a message to be left on my answering service and/or with  another person?  Yes [phone]         ___________________      __________   _________    (Signature of Patient)             (Witness)                (Date and Time)

## 2012-12-22 NOTE — Other (Signed)
Results of Urinalysis done on 12/22/12 faxed to Dr. Nedra Hai and Diamond Nickel. Fax confirmations reviewed and placed in paper chart.

## 2012-12-22 NOTE — Other (Signed)
Valla Leaver at Dr.Lee's office for consent information to be placed in connect care.

## 2012-12-23 ENCOUNTER — Inpatient Hospital Stay
Admit: 2012-12-23 | Discharge: 2012-12-27 | Disposition: A | Discharge status: Home Health | Payer: MEDICARE | Attending: Surgery | Admitting: Surgery

## 2012-12-23 DIAGNOSIS — C187 Malignant neoplasm of sigmoid colon: Secondary | ICD-10-CM

## 2012-12-23 MED ADMIN — lactated ringers infusion: INTRAVENOUS | @ 17:00:00 | NDC 00338011704

## 2012-12-23 MED ADMIN — esmolol (BREVIBLOC) 100 mg/10 mL injection: INTRAVENOUS | @ 17:00:00 | NDC 67457018200

## 2012-12-23 MED ADMIN — lidocaine (PF) (XYLOCAINE) 20 mg/mL (2 %) injection: INTRAVENOUS | @ 17:00:00 | NDC 63323020805

## 2012-12-23 MED ADMIN — dextrose 5% - 0.45% NaCl with KCl 20 mEq/L 20 mEq/L infusion: INTRAVENOUS | @ 20:00:00 | NDC 00409790209

## 2012-12-23 MED ADMIN — fentaNYL citrate (PF) injection: INTRAVENOUS | @ 17:00:00 | NDC 10019003339

## 2012-12-23 MED ADMIN — metroNIDAZOLE (FLAGYL) IVPB premix: INTRAVENOUS | @ 17:00:00 | NDC 00338105548

## 2012-12-23 MED ADMIN — levofloxacin (LEVAQUIN) 500 mg in D5W IVPB: INTRAVENOUS | @ 17:00:00 | NDC 00781334246

## 2012-12-23 MED ADMIN — dexamethasone (DECADRON) 4 mg/mL injection 4 mg: INTRAVENOUS | @ 21:00:00 | NDC 00779755160

## 2012-12-23 MED ADMIN — lactated ringers infusion: INTRAVENOUS | @ 21:00:00 | NDC 11845118709

## 2012-12-23 MED ADMIN — sodium chloride (NS) flush 5-10 mL: INTRAVENOUS | @ 18:00:00 | NDC 87701099893

## 2012-12-23 MED ADMIN — ondansetron (ZOFRAN) injection: INTRAVENOUS | @ 17:00:00 | NDC 00641607801

## 2012-12-23 MED ADMIN — ceFAZolin (ANCEF) 2g in 100 ml 0.9% NS IVPB: INTRAVENOUS | @ 17:00:00 | NDC 99990004319

## 2012-12-23 MED ADMIN — acetaminophen (OFIRMEV) 1,000 mg/100 mL (10 mg/mL) infusion: INTRAVENOUS | @ 20:00:00 | NDC 43825010201

## 2012-12-23 MED ADMIN — succinylcholine (ANECTINE) injection: INTRAVENOUS | @ 17:00:00 | NDC 00409662902

## 2012-12-23 MED ADMIN — glycopyrrolate (ROBINUL) injection: INTRAVENOUS | @ 20:00:00 | NDC 10019001639

## 2012-12-23 MED ADMIN — morphine injection 2-4 mg: INTRAVENOUS | @ 23:00:00 | NDC 00409189001

## 2012-12-23 MED ADMIN — rocuronium (ZEMURON) injection: INTRAVENOUS | @ 19:00:00 | NDC 67457022805

## 2012-12-23 MED ADMIN — metroNIDAZOLE (FLAGYL) IVPB premix 500 mg: INTRAVENOUS | @ 23:00:00 | NDC 00409781137

## 2012-12-23 MED ADMIN — dexamethasone (DECADRON) 4 mg/mL injection: INTRAVENOUS | @ 17:00:00 | NDC 67457042200

## 2012-12-23 MED ADMIN — neostigmine (PROSTIGMINE) injection: INTRAVENOUS | @ 19:00:00 | NDC 10019027010

## 2012-12-23 MED ADMIN — midazolam (VERSED) injection 1 mg: INTRAVENOUS | @ 17:00:00 | NDC 10019002836

## 2012-12-23 MED ADMIN — rocuronium (ZEMURON) injection: INTRAVENOUS | @ 17:00:00 | NDC 67457022805

## 2012-12-23 MED ADMIN — lactated ringers infusion: INTRAVENOUS | @ 16:00:00 | NDC 11845118709

## 2012-12-23 MED ADMIN — alvimopan (ENTEREG) capsule 12 mg: ORAL | @ 15:00:00 | NDC 67919002010

## 2012-12-23 MED ADMIN — bupivacaine 0.25% -EPINEPHrine 1:200,000 (SENSORCAINE) 0.25 %-1:200,000 injection: SUBCUTANEOUS | @ 19:00:00 | NDC 00409904202

## 2012-12-23 MED ADMIN — propofol (DIPRIVAN) 10 mg/mL injection: INTRAVENOUS | @ 17:00:00 | NDC 00703285903

## 2012-12-23 MED ADMIN — lactated ringers infusion: INTRAVENOUS | @ 19:00:00 | NDC 00338011704

## 2012-12-23 MED ADMIN — propofol (DIPRIVAN) 10 mg/mL injection: INTRAVENOUS | @ 20:00:00 | NDC 00703285903

## 2012-12-23 MED ADMIN — lactated ringers infusion: INTRAVENOUS | @ 15:00:00 | NDC 00409795309

## 2012-12-23 MED ADMIN — glycopyrrolate (ROBINUL) injection: INTRAVENOUS | @ 19:00:00 | NDC 10019001639

## 2012-12-23 MED ADMIN — fentaNYL citrate (PF) injection 25 mcg: INTRAVENOUS | @ 16:00:00 | NDC 68258301001

## 2012-12-23 MED FILL — ENTEREG 12 MG CAPSULE: 12 mg | ORAL | Qty: 1

## 2012-12-23 MED FILL — SODIUM CHLORIDE 0.9 % IV PIGGY BACK: INTRAVENOUS | Qty: 100

## 2012-12-23 MED FILL — MIDAZOLAM 1 MG/ML IJ SOLN: 1 mg/mL | INTRAMUSCULAR | Qty: 2

## 2012-12-23 MED FILL — IC GREEN 25 MG SOLUTION FOR INJECTION: 25 mg | INTRAMUSCULAR | Qty: 1

## 2012-12-23 MED FILL — OFIRMEV 1,000 MG/100 ML (10 MG/ML) INTRAVENOUS SOLUTION: 1000 mg/100 mL (10 mg/mL) | INTRAVENOUS | Qty: 100

## 2012-12-23 MED FILL — BUPIVACAINE-EPINEPHRINE (PF) 0.25 %-1:200,000 IJ SOLN: 0.25 %-1:200,000 | INTRAMUSCULAR | Qty: 30

## 2012-12-23 MED FILL — LIDOCAINE 2 % MUCOUS MEMBRANE JELLY IN APPLICATOR: 2 % | Qty: 10

## 2012-12-23 MED FILL — LEVOFLOXACIN IN D5W 500 MG/100 ML IV PIGGY BACK: 500 mg/100 mL | INTRAVENOUS | Qty: 100

## 2012-12-23 MED FILL — CONRAY 60 % INJECTION SOLUTION: 60 % | INTRAMUSCULAR | Qty: 50

## 2012-12-23 MED FILL — METRONIDAZOLE IN SODIUM CHLORIDE (ISO-OSM) 500 MG/100 ML IV PIGGY BACK: 500 mg/100 mL | INTRAVENOUS | Qty: 100

## 2012-12-23 MED FILL — D5-1/2 NS & POTASSIUM CHLORIDE 20 MEQ/L IV: 20 mEq/L | INTRAVENOUS | Qty: 1000

## 2012-12-23 MED FILL — ONDANSETRON (PF) 4 MG/2 ML INJECTION: 4 mg/2 mL | INTRAMUSCULAR | Qty: 2

## 2012-12-23 MED FILL — EPHEDRINE SULFATE 50 MG/ML IJ SOLN: 50 mg/mL | INTRAMUSCULAR | Qty: 1

## 2012-12-23 MED FILL — DEXAMETHASONE SODIUM PHOSPHATE 10 MG/ML IJ SOLN: 10 mg/mL | INTRAMUSCULAR | Qty: 1

## 2012-12-23 MED FILL — CEFAZOLIN 1 GRAM SOLUTION FOR INJECTION: 1 gram | INTRAMUSCULAR | Qty: 2000

## 2012-12-23 MED FILL — HYDROMORPHONE (PF) 2 MG/ML IJ SOLN: 2 mg/mL | INTRAMUSCULAR | Qty: 1

## 2012-12-23 MED FILL — FENTANYL CITRATE (PF) 50 MCG/ML IJ SOLN: 50 mcg/mL | INTRAMUSCULAR | Qty: 5

## 2012-12-23 NOTE — Other (Signed)
TRANSFER - OUT REPORT:    Verbal report given to Charlsie Quest, RN (name) on Claudia [name]  being transferred to 2120 (unit) for routine progression of care       Report consisted of patient???s Situation, Background, Assessment and   Recommendations(SBAR).     Information from the following report(s) SBAR, Kardex, OR Summary, Intake/Output and MAR was reviewed with the receiving nurse.    Opportunity for questions and clarification was provided.      Patient transported with:   Registered Nurse

## 2012-12-23 NOTE — Op Note (Signed)
Patient ID:   Name: Claudia [name]   Medical Record Number: [id number]   Date of Birth: 12/12/1955    Date of Surgery: 12/23/2012     Pre-operative Diagnosis: Colon cancer    Post-operative Diagnosis: Colon cancer    Prcoedure:  Laparoscopic sigmoid colectomy, splenic flexure mobilization    Surgeon: Anjalee Cope D Nickalas Mccarrick, MD    Assistant:  Surgeon(s):  Takera Rayl D Lavoris Sparling, MD  Scott Rhamy, MD    Anesthesia:  General    Estimated Blood Loss:   25 cc           Drains: #10 JP        Specimens: sigmoid colon    Indications: Patient 57 y.o. Caucasian female presents with abnormal colonoscopy. Patient has been having achy lower abdominal pain on and off for 6 months.  Patient also with 30-40 lbs weight loss over the 2 months.  Patient underwent colonoscopy on 08/30/12 and noted to multiple colonic polyps, diverticulosis, and ulcerated area in the sigmoid colon. Biopsy returned tubular adenoma. Patient underwent follow up colonoscopy on 12/01/12 and found large ulcerated sigmoid mass. The sigmoid colon mass biopsy returned adenocarcinoma. Patient subsequently underwent abd/pelvic CT scan on 10/08/12 which was unremarkable except for diverticulosis. Patient presents today for sigmoid colectomy.    Procedure Details:   The patient was seen in the Holding Room. The risks, benefits, complications, treatment options, and expected outcomes were discussed with the patient.  After informed consent was performed, patient was taken to the operating room and was placed supine in the operating table.  After establishing general anesthesia, abdomen was prepped and draped in standard surgical fashion.  A small lower midline incision was created with dissection down to the fascia.  Peritoneum was then sharply entered.  Gelport was then placed and CO2 pneumoperitoneum was created.  A 12mm blunt trocar was placed in the right lower quadrant.  Two additional 5mm blunt trocars were placed; one in the upper midline and the other in the left lower quadrant.   Abdomen was explored.  There was no evidence of liver metastasis.  Vascular pedicle of the sigmoid colon was then circumscribed my making a window in the mesentery.  Left ureter was identified and preserved.  Vascular pedicle was then transected using EndoGIA.  Left colon was mobilized using Harmonic scapel up to the distal transverse colon around the splenic flexure.  Care was taken to avoid an injury to the spleen.  Dissection continued down to the upper rectum keeping the course of the ureter.  Upper rectum was then circumscribed by making a window in the mesentery.  Upper rectum was transected using EndoGIA.  Proximal resection was done in the descending colon with auto-purse string stapler.  Specimen was then removed.  A 29 EEA anvil was placed in the proximal transected bowel and purse string suture was tied.  To have tension free anastomosis, the transverse colon was further mobilized proximally.  A 29 EEA stapler was then introduced through the anus and anastomosis was performed.  Care was taken to avoid twisting of the mesentery.  Once the anastomosis was performed, anastomotic rings were examined.  Full rings were noted.  Anastomosis was then checked for leak using insufflation test.  No air leak was noted. The staple line was reinforced with interrupted 2-0 Surgidec anteriorly using Endostitch. Hemostasis was good.  A #10 JP drain was placed anterior to the anastomosis through LLQ trocar site.  This was secured with 2-0 Nylon.   Trocars were removed   and CO2 pneumoperitoneum was released.  Midline fascial defect was closed with running 0-0 non-absorbable VLoc.  Subcutaneous was closed with interrupted 3-0 Vicryl.  The fascial defect on the RLQ 12 mm trocar site was closed with figure of eight 0-0 Vicryl. Skin was then approximated using 4-0 Vicryl subcuticular stitch followed by Dermabond.   Other trocar sites were approximated using 4-0 Vicryl subcuticular stitch followed by Dermabond.     Instrument,  sponge, and needle counts were correct prior to closure and at the conclusion of the case.     The patient was taken to recovery room in good condition having tolerated the procedure well.               CC: JOHN P KOWALSKI, MD

## 2012-12-23 NOTE — Other (Signed)
Bedside and Verbal shift change report given to Jenn, RN (oncoming nurse) by Sonya, RN (offgoing nurse). Report included the following information SBAR, Kardex, Intake/Output, MAR, Accordion and Recent Results.

## 2012-12-23 NOTE — Op Note (Signed)
Name:      Claudia [name], Claudia [name]                                          Surgeon:        Claudia Yager J Kya Mayfield, MD  Account #: [id number]                 Surgery Date:   12/23/2012  DOB:       05/13/1955  Age:       57                           Location:                                 OPERATIVE REPORT      PREOPERATIVE DIAGNOSIS:  Colon CA    POSTOPERATIVE DIAGNOSIS:  Colon CA    PROCEDURES PERFORMED  1. Cystoscopy.  2. Bilateral ureteral catheter placement for localization.    SURGEON: Louine Tenpenny J. Wirt Hemmerich, MD.    ANESTHESIA: General.    DESCRIPTION OF PROCEDURE: After the patient was correctly identified and  informed consent was obtained, she was brought to the operative suite and  anesthesia was undertaken. She was carefully positioned in lithotomy. The  perineum and genitalia were prepped and draped in [name] routine sterile  fashion.    The cystoscope was inserted per urethra. 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 Ureteral  orifices were serially identified. The flexible tip catheters were slid up  until resistance of the upper collecting system was felt and then  off-loaded from the scope. They were fashioned to drain through [name] 16 Foley,  which was inserted.    ESTIMATED BLOOD LOSS: None.    IV FLUIDS: Per Anesthesia.    SPECIMENS: None.    DISPOSITION: Ureteral catheters can be removed at the end the case.          Torryn Fiske J Newell Wafer, MD    cc:   Claude Swendsen J Malick Netz, MD        SJR/wmx; D: 12/23/2012 12:59 P; T: 12/23/2012 01:15 P; Doc# [id number]; Job#  [id number]

## 2012-12-23 NOTE — Progress Notes (Signed)
Patient received from PACU drowsy but arousable, family at bedside, VSS.  Oriented to room, call bell, telephone; instructed to call for help to be OOB.  Will continue to monitor.

## 2012-12-23 NOTE — Anesthesia Pre-Procedure Evaluation (Addendum)
Anesthetic History   No history of anesthetic complications           Review of Systems / Medical History  Patient summary reviewed, nursing notes reviewed and pertinent labs reviewed    Pulmonary    COPD: mild             Neuro/Psych         Psychiatric history    Comments: Schizophrenia Cardiovascular            Past MI, CAD and cardiac stents      Comments: MI 2006   GI/Hepatic/Renal     GERD: well controlled      PUD    Comments: Colon Cancer   Endo/Other        Obesity, arthritis and cancer     Other Findings   Comments: Colon Cancer         Physical Exam    Airway  Mallampati: I  TM Distance: > 6 cm  Neck ROM: normal range of motion   Mouth opening: Normal     Cardiovascular  Regular rate and rhythm,  S1 and S2 normal,  no murmur, click, rub, or gallop             Dental    Dentition: Edentulous     Pulmonary  Breath sounds clear to auscultation               Abdominal  GI exam deferred       Other Findings            Anesthetic Plan    ASA: 3  Anesthesia type: general          Induction: Intravenous  Anesthetic plan and risks discussed with: Patient

## 2012-12-23 NOTE — Other (Signed)
Handoff Report from Operating Room to PACU    Report received from A. Maryellen Pile, RN and A. Glor, CRNA regarding Claudia [name].      Surgeon(s):  Sullivan Lone, MD  Mikey Kirschner, MD  And Procedure(s) (LRB):  LAPAROSCOPIC SIGMOID COLECTOMY, CYSTO WITH BILATERAL URETERAL STENT PLACEMENT (N/A)  CYSTOSCOPY INSERTION URETERAL CATHETERS AND REMOVAL (Bilateral)  confirmed   with allergies, drains and dressings discussed.    Anesthesia type, drugs, patient history, complications, estimated blood loss, vital signs, intake and output, and last pain medication, lines, reversal medications and temperature were reviewed.

## 2012-12-23 NOTE — Interval H&P Note (Signed)
H&P Update:  Claudia [name] was seen and examined.  History and physical has been reviewed. There have been no significant clinical changes since the completion of the originally dated History and Physical.    Signed By: Sullivan Lone, MD     December 23, 2012 10:28 AM

## 2012-12-23 NOTE — Anesthesia Post-Procedure Evaluation (Signed)
Post-Anesthesia Evaluation and Assessment    Patient: Claudia [name] MRN: [id number]  SSN: [id number]    Date of Birth: 11-07-1955  Age: 57 y.o.  Sex: female       Cardiovascular Function/Vital Signs  Visit Vitals   Item Reading   ??? BP 129/58   ??? Pulse 79   ??? Temp 37.1 ??C (98.7 ??F)   ??? Resp 16   ??? Ht 5\' 4"  (1.626 m)   ??? Wt 73.483 kg (162 lb)   ??? BMI 27.79 kg/m2   ??? SpO2 98%       Patient is status post general anesthesia for Procedure(s):  LAPAROSCOPIC SIGMOID COLECTOMY, CYSTO WITH BILATERAL URETERAL STENT PLACEMENT  CYSTOSCOPY INSERTION URETERAL CATHETERS AND REMOVAL.    Nausea/Vomiting: None    Postoperative hydration reviewed and adequate.    Pain:  Pain Scale 1: Numeric (0 - 10) (12/23/12 1550)  Pain Intensity 1: 0 (12/23/12 1550)   Managed    Neurological Status:   Neuro (WDL): Exceptions to WDL (12/23/12 1630)  Neuro  Neurologic State: Alert;Eyes open spontaneously;Confused (12/23/12 1630)  Orientation Level: Oriented to person (12/23/12 1630)  Cognition: Follows commands;Poor safety awareness;Impulsive (12/23/12 1630)  Speech: Delayed responses;Clear (12/23/12 1630)  LUE Motor Response: Purposeful (12/23/12 1630)  LLE Motor Response: Purposeful (12/23/12 1630)  RUE Motor Response: Purposeful (12/23/12 1630)  RLE Motor Response: Purposeful (12/23/12 1630)   At baseline    Mental Status and Level of Consciousness: Alert and oriented     Pulmonary Status:   O2 Device: Room air (12/23/12 1046)   Adequate oxygenation and airway patent    Complications related to anesthesia: None    Post-anesthesia assessment completed. No concerns    Signed By: Atilano Ina, MD     December 23, 2012

## 2012-12-23 NOTE — Op Note (Signed)
Name:      Claudia [name], Claudia [name]                                          Surgeon:        Dione Booze, MD  Account #: [id number]                 Surgery Date:   12/23/2012  DOB:       08-01-1955  Age:       57                           Location:                                 OPERATIVE REPORT      PREOPERATIVE DIAGNOSIS:  Colon CA    POSTOPERATIVE DIAGNOSIS:  Colon CA    PROCEDURES PERFORMED  1. Cystoscopy.  2. Bilateral ureteral catheter placement for localization.    SURGEON: Dione Booze, MD.    ANESTHESIA: General.    DESCRIPTION OF PROCEDURE: After the patient was correctly identified and  informed consent was obtained, she was brought to the operative suite and  anesthesia was undertaken. She was carefully positioned in lithotomy. The  perineum and genitalia were prepped and draped in [name] routine sterile  fashion.    The cystoscope was inserted per urethra. 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 Ureteral  orifices were serially identified. The flexible tip catheters were slid up  until resistance of the upper collecting system was felt and then  off-loaded from the scope. They were fashioned to drain through [name] 16 Foley,  which was inserted.    ESTIMATED BLOOD LOSS: None.    IV FLUIDS: Per Anesthesia.    SPECIMENS: None.    DISPOSITION: Ureteral catheters can be removed at the end the case.          Dione Booze, MD    cc:   Dione Booze, MD        SJR/wmx; D: 12/23/2012 12:59 P; T: 12/23/2012 01:15 P; Doc# [id number]; Job#  [id number]

## 2012-12-23 NOTE — Op Note (Signed)
Patient ID:   Name: Claudia [name]   Medical Record Number: [id number]   Date of Birth: Apr 14, 1956    Date of Surgery: 12/23/2012     Pre-operative Diagnosis: Colon cancer    Post-operative Diagnosis: Colon cancer    Prcoedure:  Laparoscopic sigmoid colectomy, splenic flexure mobilization    Surgeon: Sullivan Lone, MD    Assistant:  Surgeon(s):  Sullivan Lone, MD  Mikey Kirschner, MD    Anesthesia:  General    Estimated Blood Loss:   25 cc           Drains: #10 JP        Specimens: sigmoid colon    Indications: Patient 57 y.o. Caucasian female presents with abnormal colonoscopy. Patient has been having achy lower abdominal pain on and off for 6 months.  Patient also with 30-40 lbs weight loss over the 2 months.  Patient underwent colonoscopy on 08/30/12 and noted to multiple colonic polyps, diverticulosis, and ulcerated area in the sigmoid colon. Biopsy returned tubular adenoma. Patient underwent follow up colonoscopy on 12/01/12 and found large ulcerated sigmoid mass. The sigmoid colon mass biopsy returned adenocarcinoma. Patient subsequently underwent abd/pelvic CT scan on 10/08/12 which was unremarkable except for diverticulosis. Patient presents today for sigmoid colectomy.    Procedure Details:   The patient was seen in the Holding Room. The risks, benefits, complications, treatment options, and expected outcomes were discussed with the patient.  After informed consent was performed, patient was taken to the operating room and was placed supine in the operating table.  After establishing general anesthesia, abdomen was prepped and draped in standard surgical fashion.  A small lower midline incision was created with dissection down to the fascia.  Peritoneum was then sharply entered.  Gelport was then placed and CO2 pneumoperitoneum was created.  A 12mm blunt trocar was placed in the right lower quadrant.  Two additional 5mm blunt trocars were placed; one in the upper midline and the other in the left lower quadrant.   Abdomen was explored.  There was no evidence of liver metastasis.  Vascular pedicle of the sigmoid colon was then circumscribed my making a window in the mesentery.  Left ureter was identified and preserved.  Vascular pedicle was then transected using EndoGIA.  Left colon was mobilized using Harmonic scapel up to the distal transverse colon around the splenic flexure.  Care was taken to avoid an injury to the spleen.  Dissection continued down to the upper rectum keeping the course of the ureter.  Upper rectum was then circumscribed by making a window in the mesentery.  Upper rectum was transected using EndoGIA.  Proximal resection was done in the descending colon with auto-purse string stapler.  Specimen was then removed.  A 29 EEA anvil was placed in the proximal transected bowel and purse string suture was tied.  To have tension free anastomosis, the transverse colon was further mobilized proximally.  A 29 EEA stapler was then introduced through the anus and anastomosis was performed.  Care was taken to avoid twisting of the mesentery.  Once the anastomosis was performed, anastomotic rings were examined.  Full rings were noted.  Anastomosis was then checked for leak using insufflation test.  No air leak was noted. The staple line was reinforced with interrupted 2-0 Surgidec anteriorly using Endostitch. Hemostasis was good.  A #10 JP drain was placed anterior to the anastomosis through LLQ trocar site.  This was secured with 2-0 Nylon.   Trocars were removed  and CO2 pneumoperitoneum was released.  Midline fascial defect was closed with running 0-0 non-absorbable VLoc.  Subcutaneous was closed with interrupted 3-0 Vicryl.  The fascial defect on the RLQ 12 mm trocar site was closed with figure of eight 0-0 Vicryl. Skin was then approximated using 4-0 Vicryl subcuticular stitch followed by Dermabond.   Other trocar sites were approximated using 4-0 Vicryl subcuticular stitch followed by Dermabond.     Instrument,  sponge, and needle counts were correct prior to closure and at the conclusion of the case.     The patient was taken to recovery room in good condition having tolerated the procedure well.               CC: Evalee Jefferson, MD

## 2012-12-24 LAB — METABOLIC PANEL, BASIC
Anion gap: 8 mmol/L (ref 5–15)
BUN/Creatinine ratio: 13 (ref 12–20)
BUN: 7 MG/DL (ref 6–20)
CO2: 28 mmol/L (ref 21–32)
Calcium: 9.1 MG/DL (ref 8.5–10.1)
Chloride: 103 mmol/L (ref 97–108)
Creatinine: 0.54 MG/DL (ref 0.45–1.15)
GFR est AA: >60 mL/min/{1.73_m2} (ref >60)
GFR est non-AA: >60 mL/min/{1.73_m2} (ref >60)
Glucose: 161 mg/dL — ABNORMAL HIGH (ref 65–100)
Potassium: 4 mmol/L (ref 3.5–5.1)
Sodium: 139 mmol/L (ref 136–145)

## 2012-12-24 LAB — CBC W/O DIFF
HCT: 36.4 % (ref 35.0–47.0)
HGB: 11.9 g/dL (ref 11.5–16.0)
MCH: 29.3 PG (ref 26.0–34.0)
MCHC: 32.7 g/dL (ref 30.0–36.5)
MCV: 89.7 FL (ref 80.0–99.0)
PLATELET: 225 10*3/uL (ref 150–400)
RBC: 4.06 M/uL (ref 3.80–5.20)
RDW: 12.6 % (ref 11.5–14.5)
WBC: 15.2 10*3/uL — ABNORMAL HIGH (ref 3.6–11.0)

## 2012-12-24 LAB — MAGNESIUM: Magnesium: 1.5 mg/dL — ABNORMAL LOW (ref 1.6–2.4)

## 2012-12-24 LAB — PHOSPHORUS: Phosphorus: 3.2 MG/DL (ref 2.5–4.9)

## 2012-12-24 LAB — CULTURE, URINE
Colonies Counted: >100000
Colony Count: >100000

## 2012-12-24 MED ADMIN — alvimopan (ENTEREG) capsule 12 mg: ORAL | @ 22:00:00 | NDC 67919002010

## 2012-12-24 MED ADMIN — pantoprazole (PROTONIX) tablet 40 mg: ORAL | @ 12:00:00 | NDC 00008060704

## 2012-12-24 MED ADMIN — dextrose 5% - 0.45% NaCl with KCl 20 mEq/L infusion: INTRAVENOUS | @ 22:00:00 | NDC 00338067104

## 2012-12-24 MED ADMIN — metroNIDAZOLE (FLAGYL) IVPB premix 500 mg: INTRAVENOUS | @ 10:00:00 | NDC 00409781137

## 2012-12-24 MED ADMIN — OLANZapine (ZYPREXA) tablet 15 mg: ORAL | @ 02:00:00 | NDC 68084052711

## 2012-12-24 MED ADMIN — morphine injection 2-4 mg: INTRAVENOUS | @ 20:00:00 | NDC 00409189001

## 2012-12-24 MED ADMIN — sodium chloride (NS) flush 5-10 mL: INTRAVENOUS | @ 02:00:00 | NDC 87701099893

## 2012-12-24 MED ADMIN — traZODone (DESYREL) tablet 200 mg: ORAL | @ 02:00:00 | NDC 68084012511

## 2012-12-24 MED ADMIN — acetaminophen (OFIRMEV) infusion 1,000 mg: INTRAVENOUS | @ 14:00:00 | NDC 43825010201

## 2012-12-24 MED ADMIN — metroNIDAZOLE (FLAGYL) IVPB premix 500 mg: INTRAVENOUS | @ 04:00:00 | NDC 00409781137

## 2012-12-24 MED ADMIN — heparin (porcine) injection 5,000 Units: SUBCUTANEOUS | NDC 25021040201

## 2012-12-24 MED ADMIN — tiotropium (SPIRIVA) inhalation capsule 18 mcg: RESPIRATORY_TRACT | @ 14:00:00 | NDC 00597007575

## 2012-12-24 MED ADMIN — acetaminophen (OFIRMEV) infusion 1,000 mg: INTRAVENOUS | @ 09:00:00 | NDC 43825010201

## 2012-12-24 MED ADMIN — heparin (porcine) injection 5,000 Units: SUBCUTANEOUS | @ 12:00:00 | NDC 25021040201

## 2012-12-24 MED ADMIN — citalopram (CELEXA) tablet 40 mg: ORAL | @ 12:00:00 | NDC 00904608561

## 2012-12-24 MED ADMIN — sodium chloride (NS) flush 5-10 mL: INTRAVENOUS | @ 10:00:00 | NDC 87701099893

## 2012-12-24 MED ADMIN — benztropine (COGENTIN) tablet 0.5 mg: ORAL | @ 02:00:00 | NDC 68084038811

## 2012-12-24 MED ADMIN — morphine injection 2-4 mg: INTRAVENOUS | @ 16:00:00 | NDC 00409189001

## 2012-12-24 MED ADMIN — morphine injection 2-4 mg: INTRAVENOUS | @ 03:00:00 | NDC 00409189001

## 2012-12-24 MED ADMIN — acetaminophen (OFIRMEV) infusion 1,000 mg: INTRAVENOUS | @ 03:00:00 | NDC 43825010201

## 2012-12-24 MED ADMIN — alvimopan (ENTEREG) capsule 12 mg: ORAL | @ 13:00:00 | NDC 67919002010

## 2012-12-24 MED ADMIN — dextrose 5% - 0.45% NaCl with KCl 20 mEq/L infusion: INTRAVENOUS | @ 09:00:00 | NDC 00338067104

## 2012-12-24 MED ADMIN — morphine injection 2-4 mg: INTRAVENOUS | @ 12:00:00 | NDC 00409189001

## 2012-12-24 MED ADMIN — alvimopan (ENTEREG) capsule 12 mg: ORAL | @ 12:00:00 | NDC 67919002010

## 2012-12-24 MED ADMIN — levofloxacin (LEVAQUIN) 500 mg in D5W IVPB: INTRAVENOUS | @ 16:00:00 | NDC 25021013267

## 2012-12-24 MED ADMIN — magnesium sulfate 2 g/50 ml IVPB (premix or compounded): INTRAVENOUS | @ 15:00:00 | NDC 00409672924

## 2012-12-24 MED FILL — DIPRIVAN 10 MG/ML INTRAVENOUS EMULSION: 10 mg/mL | INTRAVENOUS | Qty: 20

## 2012-12-24 MED FILL — CEFAZOLIN 2 G IN 100 ML 0.9% NS: 2 gram/100 mL | INTRAVENOUS | Qty: 100

## 2012-12-24 MED FILL — ESMOLOL 10 MG/ML IV SOLN: 100 mg/10 mL (10 mg/mL) | INTRAVENOUS | Qty: 10

## 2012-12-24 MED FILL — LIDOCAINE (PF) 20 MG/ML (2 %) IJ SOLN: 20 mg/mL (2 %) | INTRAMUSCULAR | Qty: 5

## 2012-12-24 MED FILL — QUELICIN 20 MG/ML INJECTION SOLUTION: 20 mg/mL | INTRAMUSCULAR | Qty: 10

## 2012-12-24 MED FILL — FENTANYL CITRATE (PF) 50 MCG/ML IJ SOLN: 50 mcg/mL | INTRAMUSCULAR | Qty: 5

## 2012-12-24 MED FILL — ROCURONIUM 10 MG/ML IV: 10 mg/mL | INTRAVENOUS | Qty: 10

## 2012-12-24 MED FILL — ONDANSETRON (PF) 4 MG/2 ML INJECTION: 4 mg/2 mL | INTRAMUSCULAR | Qty: 2

## 2012-12-24 MED FILL — GLYCOPYRROLATE 0.2 MG/ML IJ SOLN: 0.2 mg/mL | INTRAMUSCULAR | Qty: 3

## 2012-12-24 MED FILL — NEOSTIGMINE METHYLSULFATE 1 MG/ML INJECTION: 1 mg/mL | INTRAMUSCULAR | Qty: 10

## 2012-12-24 MED FILL — DEXAMETHASONE SODIUM PHOSPHATE 4 MG/ML IJ SOLN: 4 mg/mL | INTRAMUSCULAR | Qty: 5

## 2012-12-24 MED FILL — METRONIDAZOLE IN SODIUM CHLORIDE (ISO-OSM) 500 MG/100 ML IV PIGGY BACK: 500 mg/100 mL | INTRAVENOUS | Qty: 100

## 2012-12-24 MED FILL — MIDAZOLAM 1 MG/ML IJ SOLN: 1 mg/mL | INTRAMUSCULAR | Qty: 2

## 2012-12-24 NOTE — Progress Notes (Signed)
Foley d/c'd at 12noon.  Patient due to void by 2000.

## 2012-12-24 NOTE — Progress Notes (Signed)
Nutrition Assessment:    RECOMMENDATIONS:   Advance as medically able to GI lite diet   Add Ensure Clear TID as diet advanced    DIETITIANS INTERVENTIONS/PLAN:   Advance PO diet as medically appropriate  Monitor PO intake and tolerance  Monitor wt trends    ASSESSMENT:   Pt admitted for sigmoid colectomy due to abnormal colonoscopy and s/o lower abdominal pain and unintentional wt loss.  PMH: diverticulosis, sigmoid ulcers, tubular adenoma, MI, schizophrenia, GERD, hypercholesterolemia, CAD, COPD, HTN.  Pt s/p lap sigmoid colectomy and splenic flexture mobilization on 8/14.  C/o wt loss, per daughter 40# in 2 months, per EMR 8# in 7 months, pt unsure of UBW.  Pt also states that initially she was trying to lose weight, then when she stopped trying 'the weight kept coming off.'  Pt states appetite was very good PTA, no c/o n/v/d.  Pt NPO, but agreeable to try Ensure Clear as diet advances. Glucose likely elevated due to stress and pt on D5 IVF.    SUBJECTIVE/OBJECTIVE:     Diet Order: NPO  % Eaten:  No data found.    Pertinent Medications:levaquin, MagSulfate, protonix, KCl; IVF(D51/2NS @ 128ml/h)    Chemistries:  Lab Results   Component Value Date/Time    Sodium 139 12/24/2012  4:40 AM    Potassium 4.0 12/24/2012  4:40 AM    Chloride 103 12/24/2012  4:40 AM    CO2 28 12/24/2012  4:40 AM    Anion gap 8 12/24/2012  4:40 AM    Glucose 161 12/24/2012  4:40 AM    BUN 7 12/24/2012  4:40 AM    Creatinine 0.54 12/24/2012  4:40 AM    BUN/Creatinine ratio 13 12/24/2012  4:40 AM    GFR est AA >60 12/24/2012  4:40 AM    GFR est non-AA >60 12/24/2012  4:40 AM    Calcium 9.1 12/24/2012  4:40 AM    AST 19 06/23/2011  2:15 PM    Alk. phosphatase 59 06/23/2011  2:15 PM    Protein, total 7.1 06/23/2011  2:15 PM    Albumin 3.9 06/23/2011  2:15 PM    Globulin 3.2 06/23/2011  2:15 PM    A-G Ratio 1.2 06/23/2011  2:15 PM    ALT 23 06/23/2011  2:15 PM      Anthropometrics: Height: 5\' 4"  (162.6 cm) Weight: 73.483 kg (162 lb)    IBW (%IBW): 54.432 kg (120 lb)  ( ) UBW (%UBW): 77.111 kg (170 lb) (Feb 2014 (per EMR)) (  95.3%)    BMI: Body mass index is 27.79 kg/(m^2).    This BMI is indicative of:  [] Underweight   [] Normal   [x] Overweight   []  Obesity   []  Extreme Obesity (BMI>40)  Estimated Nutrition Needs (Based on): 1690 Kcals/day (MSJ 1300 x 1.3) , 81 g (1.1gPro/kg) Protein  Carbohydrate: At Least 130 g/day  Fluids: 1700 mL/day    Last BM: 8/14   [x] Active (- flatus)    [] Hyperactive  [] Hypoactive       []  Absent   BS   Skin:    []  Intact   [x]  Incision  []  Breakdown   []  DTI   []  Tears/Excoriation/Abrasion  [] Edema []  Other:   Wt Readings from Last 30 Encounters:   12/23/12 73.483 kg (162 lb)   12/23/12 73.483 kg (162 lb)   12/22/12 73.936 kg (163 lb)   12/20/12 72.122 kg (159 lb)   12/01/12 72.122 kg (159 lb)   12/01/12  72.122 kg (159 lb)   08/25/12 75.297 kg (166 lb)   08/25/12 75.297 kg (166 lb)   06/23/11 77.111 kg (170 lb)   09/16/10 77.111 kg (170 lb)   10/15/09 81.647 kg (180 lb)   06/25/09 81.647 kg (180 lb)      NUTRITION DIAGNOSES:   Problem:    Diagnosis-Clinical: Altered GI function (NC-1.4)  Diagnosis-Intake: Inadequate oral food/beverage intake (NI-2.1)  Etiology: related to pt NPO due to sigmoid colectomy yesterday  Signs/Symptoms: as evidenced by NPO + IVF meets <30% kcal and 0% protein needs.     NUTRITION INTERVENTIONS:           Advance to PO diet as medically able  Add supplement as diet advances  Monitor PO intake/tolerance  Monitor wt trends    GOAL:   Pt will advance to PO intake (clear liquids and/or GI lite diet) in 4 days  Monitor wt, goal: maintain current wt     Food/Nutrient Intake Outcomes (FI): Total energy intake (FI-1.1.1) Physical Signs/Symptoms Outcomes: Weight/weight change (S-1.1.4);GI (S-3.1.3)    CULTURAL, RELIGIOUS, OR ETHNIC DIETARY NEEDS:    [x]  None Identified   []  Identified and Addressed    LEARNING NEEDS (Diet, Food/Nutrient-Drug Interaction):    [x]  None Identified   []  Identified and Education Provided/Documented   []   Identified and Pt declined/was not appropriate      [x]  Interdisciplinary Care Plan Reviewed/Documented    []  Participated in Discharge Planning/Interdisciplinary Rounds    NUTRITION RISK:    []  High              [x]  Moderate           []   Low  []   Minimal/Uncompromised    PT SEEN FOR:    []   MD Consult: [] Calorie Count      [] Diabetic Diet Education        [] Diet Education     [] Electrolyte Management     [] General Nutrition Management and Supplements     [] Management of Tube Feeding     [] TPN Recommendations    [x]   RN Referral:  [x] MST score >=2     [] Enteral/Parenteral Nutrition PTA     [] Pregnant: Gestational DM or Multigestation   []   Low BMI  []   DTR Referral       Daneil Dan, RD  Pager [phone]  Weekend Pager [phone]

## 2012-12-24 NOTE — Other (Signed)
Pharmacy: IV Acetaminophen    Use of IV acetaminophen will be restricted to patients whose oral route is not usable.    The order durations may not exceed 24 hours.  Orders without a stop date or with a duration exceeding 24 hours will automatically be converted to 24 hours duration.    Patients tolerating other oral medication will automatically be converted to oral acetaminophen.    IV Acetaminophen discontinued after patient received 4 doses    Thanks,   Merdis Delay, PHARMD

## 2012-12-24 NOTE — Progress Notes (Signed)
Readmission Risk Assessment:     Moderate Risk and MSSP/Good Help ACO patients    RRAT Score:  10 - 18    Initial Assessment:  Pt is a 57 yo female admitted for laparoscopic sigmoid colectomy secondary to recent diagnosis of colon CA. Pt lives w/ her son and family; was independent for mobility and ADLs pta.     Emergency Contact:  Besse Miron - [phone]- son    Pertinent Medical Hx:   Schizophrenia; hx of MI; GERD; CAD;   COPD; current everyday smoker    PCP/Specialists: Dr. Lynnae Sandhoff; Dr. Jearld Lesch      Community Services:   none    DME:      nebulizer    Moderate Risk Care Transition Plan:  1. Evaluate for [facility] or H2H, SNF, acute rehab, community care coordination of resources. Pt is POD 1 and has JP drain - eager to advance her diet. Pt lives w/ family so will not have long periods alone at d/c. Pt was agreeable to [facility] if MD deems necessary.  2. Involve patient/caregiver in assessment, planning, education and implement of intervention. Yes.  3. CM daily patient care huddles/interdisciplinary rounds. Pt was discussed in unit rounds.  4. PCP/Specialist appointment within 5 ??? 7 days made prior to discharge. Pt prefers to make her own appointment to coordinate w/ daughter-in-law's schedule.  5. Facilitate transportation and logistics for follow-up appointments.    6. Medication reconciliation ??? Pharmacy  7. Formal handoff between hospital provider and post-acute provider to transition patient  Handoff to [facility] Group Nurse Navigator or PCP practice.    Vernice Jefferson, MSW [phone]

## 2012-12-24 NOTE — Progress Notes (Signed)
Pharmacy: IV Acetaminophen    Use of IV acetaminophen will be restricted to patients whose oral route is not usable.    The order durations may not exceed 24 hours.  Orders without a stop date or with a duration exceeding 24 hours will automatically be converted to 24 hours duration.    Patients tolerating other oral medication will automatically be converted to oral acetaminophen.    IV Acetaminophen discontinued after patient received 4 doses    Thanks.  Redgie Grayer, [facility]

## 2012-12-24 NOTE — Progress Notes (Signed)
SURGERY PROGRESS NOTE      Admit Date: 12/23/2012    POD 1 Day Post-Op    Procedure: Procedure(s):  LAPAROSCOPIC SIGMOID COLECTOMY, CYSTO WITH BILATERAL URETERAL STENT PLACEMENT  CYSTOSCOPY INSERTION URETERAL CATHETERS AND REMOVAL      Subjective:     Feeling well other than incisional pain.  No flatus.    Objective:     BP 125/57   Pulse 60   Temp(Src) 98.2 ??F (36.8 ??C)   Resp 18   Ht 5\' 4"  (1.626 m)   Wt 73.483 kg (162 lb)   BMI 27.79 kg/m2   SpO2 95%   Breastfeeding? No     Temp (24hrs), Avg:98.2 ??F (36.8 ??C), Min:97.4 ??F (36.3 ??C), Max:98.7 ??F (37.1 ??C)      Physical Exam:     Abdomen:  Soft.  Non-distended.  Incision C/D/I.          Component Value Date/Time   WBC 15.2 12/24/2012  4:40 AM   HGB 11.9 12/24/2012  4:40 AM   HCT 36.4 12/24/2012  4:40 AM   PLATELET 225 12/24/2012  4:40 AM   MCV 89.7 12/24/2012  4:40 AM       Component Value Date/Time   GFR est AA >60 12/24/2012  4:40 AM   GFR est non-AA >60 12/24/2012  4:40 AM   Creatinine (POC) 0.6 10/08/2012  8:06 AM   Creatinine 0.54 12/24/2012  4:40 AM   BUN 7 12/24/2012  4:40 AM   Sodium 139 12/24/2012  4:40 AM   Potassium 4.0 12/24/2012  4:40 AM   Chloride 103 12/24/2012  4:40 AM   CO2 28 12/24/2012  4:40 AM        Assessment:     Active Problems:    Colon cancer (12/23/2012)    UTI  POD #1    Plan/Recommendations/Medical Decision Making:     Ambulate.  Await for return of bowel function.  Replete Mg.  Continue antibiotics for UTI  D/c foley

## 2012-12-25 MED ADMIN — OLANZapine (ZYPREXA) tablet 15 mg: ORAL | @ 01:00:00 | NDC 68084052711

## 2012-12-25 MED ADMIN — magnesium sulfate 1 g/100 ml IVPB (premix or compounded): INTRAVENOUS | @ 16:00:00 | NDC 00409672723

## 2012-12-25 MED ADMIN — acetaminophen (TYLENOL) tablet 1,000 mg: ORAL | @ 09:00:00 | NDC 00904198861

## 2012-12-25 MED ADMIN — dextrose 5% - 0.45% NaCl with KCl 20 mEq/L infusion: INTRAVENOUS | @ 07:00:00 | NDC 00338067104

## 2012-12-25 MED ADMIN — morphine injection 2-4 mg: INTRAVENOUS | @ 22:00:00 | NDC 00409189001

## 2012-12-25 MED ADMIN — alvimopan (ENTEREG) capsule 12 mg: ORAL | @ 21:00:00 | NDC 67919002010

## 2012-12-25 MED ADMIN — citalopram (CELEXA) tablet 40 mg: ORAL | @ 11:00:00 | NDC 68084006611

## 2012-12-25 MED ADMIN — morphine injection 2-4 mg: INTRAVENOUS | @ 18:00:00 | NDC 00409189001

## 2012-12-25 MED ADMIN — morphine injection 2-4 mg: INTRAVENOUS | @ 13:00:00 | NDC 00409189001

## 2012-12-25 MED ADMIN — diphenhydrAMINE (BENADRYL) injection 12.5 mg: INTRAVENOUS | @ 18:00:00 | NDC 63323066401

## 2012-12-25 MED ADMIN — acetaminophen (TYLENOL) tablet 1,000 mg: ORAL | @ 21:00:00 | NDC 00904198861

## 2012-12-25 MED ADMIN — sodium chloride (NS) flush 5-10 mL: INTRAVENOUS | @ 21:00:00 | NDC 87701099893

## 2012-12-25 MED ADMIN — OLANZapine (ZYPREXA) tablet 15 mg: ORAL | @ 02:00:00 | NDC 72241005511

## 2012-12-25 MED ADMIN — sodium chloride (NS) flush 5-10 mL: INTRAVENOUS | @ 09:00:00 | NDC 87701099893

## 2012-12-25 MED ADMIN — diphenhydrAMINE (BENADRYL) injection 12.5 mg: INTRAVENOUS | @ 01:00:00 | NDC 63323066401

## 2012-12-25 MED ADMIN — benztropine (COGENTIN) tablet 0.5 mg: ORAL | @ 02:00:00 | NDC 76385010410

## 2012-12-25 MED ADMIN — acetaminophen (TYLENOL) tablet 1,000 mg: ORAL | @ 16:00:00 | NDC 00904198861

## 2012-12-25 MED ADMIN — dextrose 5% - 0.45% NaCl with KCl 20 mEq/L infusion: INTRAVENOUS | @ 16:00:00 | NDC 00338067104

## 2012-12-25 MED ADMIN — acetaminophen (TYLENOL) tablet 1,000 mg: ORAL | @ 04:00:00 | NDC 00904198861

## 2012-12-25 MED ADMIN — pantoprazole (PROTONIX) tablet 40 mg: ORAL | @ 11:00:00 | NDC 00008060704

## 2012-12-25 MED ADMIN — morphine injection 2-4 mg: INTRAVENOUS | NDC 00409189001

## 2012-12-25 MED ADMIN — traZODone (DESYREL) tablet 200 mg: ORAL | @ 01:00:00 | NDC 68084012511

## 2012-12-25 MED ADMIN — morphine injection 2-4 mg: INTRAVENOUS | @ 09:00:00 | NDC 00409189001

## 2012-12-25 MED ADMIN — morphine injection 2-4 mg: INTRAVENOUS | @ 04:00:00 | NDC 00409189001

## 2012-12-25 MED ADMIN — benztropine (COGENTIN) tablet 0.5 mg: ORAL | @ 01:00:00 | NDC 68084038811

## 2012-12-25 MED ADMIN — sodium chloride (NS) flush 5-10 mL: INTRAVENOUS | @ 02:00:00 | NDC 87701099893

## 2012-12-25 MED ADMIN — levofloxacin (LEVAQUIN) 500 mg in D5W IVPB: INTRAVENOUS | @ 16:00:00 | NDC 25021013267

## 2012-12-25 MED ADMIN — diphenhydrAMINE (BENADRYL) injection 12.5 mg: INTRAVENOUS | @ 14:00:00 | NDC 63323066401

## 2012-12-25 MED ADMIN — traZODone (DESYREL) tablet 200 mg: ORAL | @ 02:00:00 | NDC 76420064890

## 2012-12-25 MED ADMIN — heparin (porcine) injection 5,000 Units: SUBCUTANEOUS | @ 13:00:00 | NDC 25021040201

## 2012-12-25 MED ADMIN — alvimopan (ENTEREG) capsule 12 mg: ORAL | @ 13:00:00 | NDC 67919002010

## 2012-12-25 MED ADMIN — tiotropium (SPIRIVA) inhalation capsule 18 mcg: RESPIRATORY_TRACT | @ 13:00:00 | NDC 00597007575

## 2012-12-25 NOTE — Progress Notes (Signed)
Admit Date: 12/23/2012    POD 2 Days Post-Op    Procedure:  Procedure(s):  LAPAROSCOPIC SIGMOID COLECTOMY, CYSTO WITH BILATERAL URETERAL STENT PLACEMENT  CYSTOSCOPY INSERTION URETERAL CATHETERS AND REMOVAL    Subjective:     Patient has complaints of incisional pain.  Flatus x 4.  Wants to eat.    Objective:     Blood pressure 139/69, pulse 51, temperature 98.4 ??F (36.9 ??C), resp. rate 16, height 5\' 4"  (1.626 m), weight 162 lb (73.483 kg), SpO2 90.00%, not currently breastfeeding.    Temp (24hrs), Avg:98 ??F (36.7 ??C), Min:97.6 ??F (36.4 ??C), Max:98.4 ??F (36.9 ??C)      Physical Exam:  GENERAL: alert, cooperative, no distress, appears stated age, LUNG: clear to auscultation bilaterally, HEART: regular rate and rhythm, S1, S2 normal, no murmur, click, rub or gallop, ABDOMEN: soft, non-tender. Bowel sounds normal. No masses,  no organomegaly, wounds c/d/i, scant serosanguinous JP o/p, EXTREMITIES:  extremities normal, atraumatic, no cyanosis or edema    Labs: No results found for this or any previous visit (from the past 24 hour(s)).    Data Review images and reports reviewed    Assessment:     Active Problems:    Colon cancer (12/23/2012)      UTI (lower urinary tract infection) (12/24/2012)        Plan/Recommendations/Medical Decision Making:     Continue present treatment  Clear liquid diet  Ambulate  Replace Mg    Shelagh Rayman D. Lucretia Roers, MD, [facility]  [facility]

## 2012-12-26 LAB — CBC WITH AUTOMATED DIFF
ABS. BASOPHILS: 0 10*3/uL (ref 0.0–0.1)
ABS. EOSINOPHILS: 0.2 10*3/uL (ref 0.0–0.4)
ABS. LYMPHOCYTES: 1.8 10*3/uL (ref 0.8–3.5)
ABS. MONOCYTES: 0.5 10*3/uL (ref 0.0–1.0)
ABS. NEUTROPHILS: 5.2 10*3/uL (ref 1.8–8.0)
BASOPHILS: 0 % (ref 0–1)
EOSINOPHILS: 3 % (ref 0–7)
HCT: 36.5 % (ref 35.0–47.0)
HGB: 11.7 g/dL (ref 11.5–16.0)
LYMPHOCYTES: 23 % (ref 12–49)
MCH: 29.2 PG (ref 26.0–34.0)
MCHC: 32.1 g/dL (ref 30.0–36.5)
MCV: 91 FL (ref 80.0–99.0)
MONOCYTES: 7 % (ref 5–13)
NEUTROPHILS: 67 % (ref 32–75)
PLATELET: 212 10*3/uL (ref 150–400)
RBC: 4.01 M/uL (ref 3.80–5.20)
RDW: 12.9 % (ref 11.5–14.5)
WBC: 7.7 10*3/uL (ref 3.6–11.0)

## 2012-12-26 LAB — METABOLIC PANEL, BASIC
Anion gap: 5 mmol/L (ref 5–15)
BUN/Creatinine ratio: 7 — ABNORMAL LOW (ref 12–20)
BUN: 4 MG/DL — ABNORMAL LOW (ref 6–20)
CO2: 29 mmol/L (ref 21–32)
Calcium: 8.8 MG/DL (ref 8.5–10.1)
Chloride: 104 mmol/L (ref 97–108)
Creatinine: 0.55 MG/DL (ref 0.45–1.15)
GFR est AA: >60 mL/min/{1.73_m2} (ref >60)
GFR est non-AA: >60 mL/min/{1.73_m2} (ref >60)
Glucose: 128 mg/dL — ABNORMAL HIGH (ref 65–100)
Potassium: 3.7 mmol/L (ref 3.5–5.1)
Sodium: 138 mmol/L (ref 136–145)

## 2012-12-26 LAB — MAGNESIUM: Magnesium: 1.3 mg/dL — ABNORMAL LOW (ref 1.6–2.4)

## 2012-12-26 MED ADMIN — morphine injection 2-4 mg: INTRAVENOUS | @ 14:00:00 | NDC 00409189001

## 2012-12-26 MED ADMIN — benztropine (COGENTIN) tablet 0.5 mg: ORAL | @ 02:00:00 | NDC 68084038811

## 2012-12-26 MED ADMIN — citalopram (CELEXA) tablet 40 mg: ORAL | @ 10:00:00 | NDC 68084074411

## 2012-12-26 MED ADMIN — sodium chloride (NS) flush 5-10 mL: INTRAVENOUS | @ 17:00:00 | NDC 87701099893

## 2012-12-26 MED ADMIN — sodium chloride (NS) flush 5-10 mL: INTRAVENOUS | @ 02:00:00 | NDC 87701099893

## 2012-12-26 MED ADMIN — dextrose 5% - 0.45% NaCl with KCl 20 mEq/L infusion: INTRAVENOUS | @ 20:00:00 | NDC 00409790209

## 2012-12-26 MED ADMIN — potassium chloride 10 mEq in 100 ml IVPB: INTRAVENOUS | @ 15:00:00 | NDC 00409707426

## 2012-12-26 MED ADMIN — morphine injection 2-4 mg: INTRAVENOUS | @ 22:00:00 | NDC 00409189001

## 2012-12-26 MED ADMIN — sodium chloride (NS) flush 10-40 mL: @ 21:00:00 | NDC 87701099893

## 2012-12-26 MED ADMIN — traZODone (DESYREL) tablet 200 mg: ORAL | @ 02:00:00 | NDC 68084012511

## 2012-12-26 MED ADMIN — acetaminophen (TYLENOL) tablet 1,000 mg: ORAL | @ 16:00:00 | NDC 00904198861

## 2012-12-26 MED ADMIN — OLANZapine (ZYPREXA) tablet 15 mg: ORAL | @ 02:00:00 | NDC 68084052611

## 2012-12-26 MED ADMIN — sodium chloride (NS) flush 5-10 mL: INTRAVENOUS | @ 10:00:00 | NDC 87701099893

## 2012-12-26 MED ADMIN — morphine injection 2-4 mg: INTRAVENOUS | @ 17:00:00 | NDC 00409189001

## 2012-12-26 MED ADMIN — acetaminophen (TYLENOL) tablet 1,000 mg: ORAL | @ 10:00:00 | NDC 00904198861

## 2012-12-26 MED ADMIN — tiotropium (SPIRIVA) inhalation capsule 18 mcg: RESPIRATORY_TRACT | @ 12:00:00 | NDC 00597007575

## 2012-12-26 MED ADMIN — sodium chloride (NS) flush 10 mL: @ 22:00:00 | NDC 87701099893

## 2012-12-26 MED ADMIN — potassium chloride 10 mEq in 100 ml IVPB: INTRAVENOUS | @ 22:00:00 | NDC 00409707426

## 2012-12-26 MED ADMIN — heparin (porcine) injection 5,000 Units: SUBCUTANEOUS | @ 02:00:00 | NDC 25021040201

## 2012-12-26 MED ADMIN — morphine injection 2-4 mg: INTRAVENOUS | @ 05:00:00 | NDC 00409189001

## 2012-12-26 MED ADMIN — diphenhydrAMINE (BENADRYL) injection 12.5 mg: INTRAVENOUS | @ 22:00:00 | NDC 00641037621

## 2012-12-26 MED ADMIN — heparin (porcine) injection 5,000 Units: SUBCUTANEOUS | @ 12:00:00 | NDC 25021040201

## 2012-12-26 MED ADMIN — alvimopan (ENTEREG) capsule 12 mg: ORAL | @ 12:00:00 | NDC 67919002010

## 2012-12-26 MED ADMIN — acetaminophen (TYLENOL) tablet 1,000 mg: ORAL | @ 05:00:00 | NDC 00904198861

## 2012-12-26 MED ADMIN — alvimopan (ENTEREG) capsule 12 mg: ORAL | @ 22:00:00 | NDC 67919002010

## 2012-12-26 MED ADMIN — pantoprazole (PROTONIX) tablet 40 mg: ORAL | @ 10:00:00 | NDC 00008060704

## 2012-12-26 MED ADMIN — dextrose 5% - 0.45% NaCl with KCl 20 mEq/L infusion: INTRAVENOUS | @ 05:00:00 | NDC 00338067104

## 2012-12-26 MED ADMIN — acetaminophen (TYLENOL) tablet 1,000 mg: ORAL | @ 22:00:00 | NDC 00904198861

## 2012-12-26 MED ADMIN — potassium chloride 10 mEq in 100 ml IVPB: INTRAVENOUS | @ 14:00:00 | NDC 00338070948

## 2012-12-26 NOTE — Progress Notes (Signed)
IV infiltrated with IV potassium, redness around site.

## 2012-12-26 NOTE — Progress Notes (Addendum)
Difficult PIV attempts  Called by primary nurse to resite PIV post unsuccessful attempts.   A 20G angio 13/4" placed in the RIGHT upper arm using U/S.  Unsuccessful attempts x 2 without U/S.   Patient tolerated procedure well.   Primary nurse aware to monitor site for s/s of infiltration.    Stasia Cavalier RN [facility]

## 2012-12-26 NOTE — Progress Notes (Signed)
Admit Date: 12/23/2012    POD 3 Days Post-Op    Procedure:  Procedure(s):  LAPAROSCOPIC SIGMOID COLECTOMY, CYSTO WITH BILATERAL URETERAL STENT PLACEMENT  CYSTOSCOPY INSERTION URETERAL CATHETERS AND REMOVAL    Subjective:     Patient has complaints of incisional pain.  tol clears.  + loose BMs.     Objective:     Blood pressure 148/81, pulse 45, temperature 98.4 ??F (36.9 ??C), resp. rate 18, height 5\' 4"  (1.626 m), weight 162 lb (73.483 kg), SpO2 97.00%, not currently breastfeeding.    Temp (24hrs), Avg:98 ??F (36.7 ??C), Min:97.4 ??F (36.3 ??C), Max:98.4 ??F (36.9 ??C)      Physical Exam:  GENERAL: alert, cooperative, no distress, appears stated age, LUNG: clear to auscultation bilaterally, HEART: regular rate and rhythm, S1, S2 normal, no murmur, click, rub or gallop, ABDOMEN: soft, non-tender. Bowel sounds normal. No masses,  no organomegaly, wounds c/d/i, scant serosanguinous JP o/p, EXTREMITIES:  extremities normal, atraumatic, no cyanosis or edema    Labs:   Recent Results (from the past 24 hour(s))   CBC WITH AUTOMATED DIFF    Collection Time     12/26/12  3:45 AM       Result Value Range    WBC 7.7  3.6 - 11.0 K/uL    RBC 4.01  3.80 - 5.20 M/uL    HGB 11.7  11.5 - 16.0 g/dL    HCT 08.6  57.8 - 46.9 %    MCV 91.0  80.0 - 99.0 FL    MCH 29.2  26.0 - 34.0 PG    MCHC 32.1  30.0 - 36.5 g/dL    RDW 62.9  52.8 - 41.3 %    PLATELET 212  150 - 400 K/uL    NEUTROPHILS 67  32 - 75 %    LYMPHOCYTES 23  12 - 49 %    MONOCYTES 7  5 - 13 %    EOSINOPHILS 3  0 - 7 %    BASOPHILS 0  0 - 1 %    ABS. NEUTROPHILS 5.2  1.8 - 8.0 K/UL    ABS. LYMPHOCYTES 1.8  0.8 - 3.5 K/UL    ABS. MONOCYTES 0.5  0.0 - 1.0 K/UL    ABS. EOSINOPHILS 0.2  0.0 - 0.4 K/UL    ABS. BASOPHILS 0.0  0.0 - 0.1 K/UL   METABOLIC PANEL, BASIC    Collection Time     12/26/12  3:45 AM       Result Value Range    Sodium 138  136 - 145 mmol/L    Potassium 3.7  3.5 - 5.1 mmol/L    Chloride 104  97 - 108 mmol/L    CO2 29  21 - 32 mmol/L    Anion gap 5  5 - 15 mmol/L     Glucose 128 (*) 65 - 100 mg/dL    BUN 4 (*) 6 - 20 MG/DL    Creatinine 2.44  0.10 - 1.15 MG/DL    BUN/Creatinine ratio 7 (*) 12 - 20      GFR est AA >60  >60 ml/min/1.83m2    GFR est non-AA >60  >60 ml/min/1.26m2    Calcium 8.8  8.5 - 10.1 MG/DL   MAGNESIUM    Collection Time     12/26/12  3:45 AM       Result Value Range    Magnesium 1.3 (*) 1.6 - 2.4 mg/dL       Data Review images  and reports reviewed    Assessment:     Active Problems:    Colon cancer (12/23/2012)      UTI (lower urinary tract infection) (12/24/2012)        Plan/Recommendations/Medical Decision Making:     Continue present treatment  Full liquid diet  Ambulate  Replace Mg and K    Tanaysia Bhardwaj D. Lucretia Roers, MD, [facility]  [facility]

## 2012-12-26 NOTE — Progress Notes (Signed)
Bedside and Verbal shift change report given to Laney Pastor (oncoming nurse) by Lucienne Capers Rn (offgoing nurse). Report included the following information SBAR, Kardex, Procedure Summary, Intake/Output, MAR, Accordion and Recent Results.

## 2012-12-26 NOTE — Other (Signed)
Bedside and Verbal shift change report given to Anne Marie RN (oncoming nurse) by Stephanie RN (offgoing nurse). Report included the following information SBAR, Kardex, Intake/Output, MAR, Accordion and Recent Results.

## 2012-12-26 NOTE — Progress Notes (Addendum)
IV placed by PICC team has infiltrated, awaiting call back from Dr. Lucretia Roers to have a PICC line placed    1418- Paged Dr. Lucretia Roers again, awaiting return call

## 2012-12-26 NOTE — Progress Notes (Signed)
@  1545  3CG PICC tip confirmation verified with second PICC nurse C.Smith RN [facility].  Printout placed on the chart.  Primary nurse aware PICC catheter may be used (see PICC order set) and to hang new infusion tubing prior to connecting to PICC line.    Stasia Cavalier RN [facility]    @1530   Inserted RIGHT TRIPLE lumen 6 fr PICC in  RIGHT BASILIC at 32cm with external length 0 cm out,   using using modified seldinger technique, Sherlock TLS and 3CG TPS.  (5 fr occupies 10% of the RIGHT BASILIC vein according to U/S measurement)   Pt's rhythm SB / HR 60's.  Sherlock Tip Placement device, Ultrasound guidance, Lidocaine 1% used (yes)   and amount of lidocaine used (1.73ml), maximum sterile barrier precautions observed.  Reason for access (U/S PIV infiltration/multiple sticks).  Complications related to insertion (none).  Patient tolerated procedure well with minimal blood loss.   Sterile dressing applied with Biopatch, Stat loc and Tegaderm.   PICC Booklet/Handout provided.   RIGHT arm circumference:41 cm.   Timeout verified the correct patient and correct procedure.   Brand of catheter BARD SOLO POWER PICC Lot REYF [id number] [id number].  Assisting PICC nurse: Elenore Paddy.Spagnuoulo RN.  Stasia Cavalier RN [facility]    @1430   PICC Education: Explained reason and rationale for PICC placement along with providing education in order to make an informed consent including nature, risks, benefits, potential complications, care and maintenance of PICC line.  The opportunity for questions or concerns was given. A 'Patient PICC Handbook' and PICC nurse's pager number provided for future questions and concerns. Patient gave written consent for PICC procedure to be done at the bedside. Patient verbalizes understanding.     Stasia Cavalier RN [facility]    PICC evaluation  Lab Results   Component Value Date/Time    Creatinine 0.55 12/26/2012  3:45 AM     Lab Results   Component Value Date/Time    BUN 4  12/26/2012  3:45 AM     Lab Results   Component Value Date/Time    WBC 7.7 12/26/2012  3:45 AM     Lab Results   Component Value Date/Time    PLATELET 212 12/26/2012  3:45 AM     No results found for this basename: INR, INRI, INRMR, PTMR, PTP, PT1, PT2, INRPOC

## 2012-12-27 LAB — METABOLIC PANEL, BASIC
Anion gap: 6 mmol/L (ref 5–15)
BUN/Creatinine ratio: 10 — ABNORMAL LOW (ref 12–20)
BUN: 3 MG/DL — ABNORMAL LOW (ref 6–20)
CO2: 29 mmol/L (ref 21–32)
Calcium: 8.8 MG/DL (ref 8.5–10.1)
Chloride: 106 mmol/L (ref 97–108)
Creatinine: 0.31 MG/DL — ABNORMAL LOW (ref 0.45–1.15)
GFR est AA: >60 mL/min/{1.73_m2} (ref >60)
GFR est non-AA: >60 mL/min/{1.73_m2} (ref >60)
Glucose: 107 mg/dL — ABNORMAL HIGH (ref 65–100)
Potassium: 4.3 mmol/L (ref 3.5–5.1)
Sodium: 141 mmol/L (ref 136–145)

## 2012-12-27 LAB — MAGNESIUM: Magnesium: 1.7 mg/dL (ref 1.6–2.4)

## 2012-12-27 MED ADMIN — acetaminophen (TYLENOL) tablet 1,000 mg: ORAL | @ 19:00:00 | NDC 00904198861

## 2012-12-27 MED ADMIN — sodium chloride (NS) flush 10-40 mL: @ 19:00:00 | NDC 87701099893

## 2012-12-27 MED ADMIN — heparin (porcine) injection 5,000 Units: SUBCUTANEOUS | @ 14:00:00 | NDC 25021040201

## 2012-12-27 MED ADMIN — pantoprazole (PROTONIX) tablet 40 mg: ORAL | @ 10:00:00 | NDC 00008060704

## 2012-12-27 MED ADMIN — levofloxacin (LEVAQUIN) 500 mg in D5W IVPB: INTRAVENOUS | @ 18:00:00 | NDC 25021013267

## 2012-12-27 MED ADMIN — heparin (porcine) injection 5,000 Units: SUBCUTANEOUS | @ 02:00:00 | NDC 25021040201

## 2012-12-27 MED ADMIN — sodium chloride (NS) flush 5-10 mL: INTRAVENOUS | @ 10:00:00 | NDC 87701099893

## 2012-12-27 MED ADMIN — morphine injection 2-4 mg: INTRAVENOUS | @ 19:00:00 | NDC 00409189001

## 2012-12-27 MED ADMIN — acetaminophen (TYLENOL) tablet 1,000 mg: ORAL | @ 09:00:00 | NDC 00904198861

## 2012-12-27 MED ADMIN — OLANZapine (ZYPREXA) tablet 15 mg: ORAL | @ 02:00:00 | NDC 68084052711

## 2012-12-27 MED ADMIN — traZODone (DESYREL) tablet 200 mg: ORAL | @ 02:00:00 | NDC 68084012511

## 2012-12-27 MED ADMIN — sodium chloride (NS) flush 10-30 mL: @ 07:00:00 | NDC 87701099893

## 2012-12-27 MED ADMIN — sodium chloride (NS) flush 5-10 mL: INTRAVENOUS | @ 19:00:00 | NDC 87701099893

## 2012-12-27 MED ADMIN — citalopram (CELEXA) tablet 40 mg: ORAL | @ 10:00:00 | NDC 68084074411

## 2012-12-27 MED ADMIN — sodium chloride (NS) flush 10-40 mL: @ 02:00:00 | NDC 87701099893

## 2012-12-27 MED ADMIN — sodium chloride (NS) flush 10 mL: @ 04:00:00 | NDC 87701099893

## 2012-12-27 MED ADMIN — benztropine (COGENTIN) tablet 0.5 mg: ORAL | @ 02:00:00 | NDC 68084038811

## 2012-12-27 MED ADMIN — sodium chloride (NS) flush 10-40 mL: @ 09:00:00 | NDC 87701099893

## 2012-12-27 MED ADMIN — morphine injection 2-4 mg: INTRAVENOUS | @ 14:00:00 | NDC 00409189001

## 2012-12-27 MED ADMIN — dextrose 5% - 0.45% NaCl with KCl 20 mEq/L infusion: INTRAVENOUS | @ 04:00:00 | NDC 00409790209

## 2012-12-27 MED ADMIN — tiotropium (SPIRIVA) inhalation capsule 18 mcg: RESPIRATORY_TRACT | @ 14:00:00 | NDC 00597007575

## 2012-12-27 MED ADMIN — alvimopan (ENTEREG) capsule 12 mg: ORAL | @ 14:00:00 | NDC 67919002010

## 2012-12-27 NOTE — Progress Notes (Signed)
Discharge Reassessment Plan:  Moderate Risk    RRAT Score:  10 - 18     Moderate Risk Care Transition Interventions:  1. Discharge transition plan:  Pt is a 57 y/o female admitted for lower abdominal pain.  Pt had surgery on 12/23/12.  Pt to d/c with JP drain and requesting HH.  Pt reported that she resides with her dtr in law and her family and needed assisting with caring for drain.  HH referral was sent to [facility].    2. Involved patient/caregiver in assessment, planning, education and implement of intervention.  Pt was involved with d/c planning.  Pt inquired about assistance with her JP drain and requesting HH.  3. CM daily patient care huddles/interdisciplinary rounds were completed.  Pt tx discussed during team rounds and with CCL.  4. PCP/Specialist appointment within 5 days made prior to discharge.  Date/Time  Pt stated she would allow her dtr in law to schedule her follow up appt due to her providing transportation to and from all appts.  5. Facilitated transportation and logistics for follow-up appointments.  Dtr in law arrived to transport pt home upon d/c.  6. Facilitated Medication reconciliation ??? Pharmacy.  Date/Time  7. Formal handoff between hospital provider and post-acute provider to transition patient completed. Pt provided d/c information by nurse.  Pt will be contacted by [facility].   8. Handoff to [facility] Group Nurse Navigator or PCP practice completed.  Pt will follow up with dr in 1 week per her d/c order.    Yvonna Alanis, MSW  [phone]

## 2012-12-27 NOTE — Progress Notes (Signed)
Admit Date: 12/23/2012    POD 4 Days Post-Op    Procedure:  Procedure(s):  LAPAROSCOPIC SIGMOID COLECTOMY, CYSTO WITH BILATERAL URETERAL STENT PLACEMENT  CYSTOSCOPY INSERTION URETERAL CATHETERS AND REMOVAL      Assessment:   Active Problems:    Colon cancer (12/23/2012)      UTI (lower urinary tract infection) (12/24/2012)      1. S/p colectomy.  Tolerating some full liquids  2. UTI POA cont abx  3. Diarrhea improving      Plan/Recommendations/Medical Decision Making:     1.  Advance to GI lite diet  2.  Possibly home later today or tomorrow    Subjective:     Patient has complaints of pain.     Objective:     Blood pressure 135/84, pulse 44, temperature 98.4 ??F (36.9 ??C), resp. rate 16, height 5\' 4"  (1.626 m), weight 73.483 kg (162 lb), SpO2 93.00%, not currently breastfeeding.    Temp (24hrs), Avg:98.2 ??F (36.8 ??C), Min:97.9 ??F (36.6 ??C), Max:98.4 ??F (36.9 ??C)      Physical Exam:  LUNG: clear to auscultation bilaterally, HEART: regular rate and rhythm, S1, S2 normal, no murmur, click, rub or gallop, ABDOMEN: soft, non-distended approp tender. Bowel sounds normal. No masses,  no organomegaly, EXTREMITIES:  extremities normal, atraumatic, no cyanosis or edema    Labs:   Recent Results (from the past 48 hour(s))   CBC WITH AUTOMATED DIFF    Collection Time     12/26/12  3:45 AM       Result Value Range    WBC 7.7  3.6 - 11.0 K/uL    RBC 4.01  3.80 - 5.20 M/uL    HGB 11.7  11.5 - 16.0 g/dL    HCT 09.8  11.9 - 14.7 %    MCV 91.0  80.0 - 99.0 FL    MCH 29.2  26.0 - 34.0 PG    MCHC 32.1  30.0 - 36.5 g/dL    RDW 82.9  56.2 - 13.0 %    PLATELET 212  150 - 400 K/uL    NEUTROPHILS 67  32 - 75 %    LYMPHOCYTES 23  12 - 49 %    MONOCYTES 7  5 - 13 %    EOSINOPHILS 3  0 - 7 %    BASOPHILS 0  0 - 1 %    ABS. NEUTROPHILS 5.2  1.8 - 8.0 K/UL    ABS. LYMPHOCYTES 1.8  0.8 - 3.5 K/UL    ABS. MONOCYTES 0.5  0.0 - 1.0 K/UL    ABS. EOSINOPHILS 0.2  0.0 - 0.4 K/UL    ABS. BASOPHILS 0.0  0.0 - 0.1 K/UL   METABOLIC PANEL, BASIC    Collection  Time     12/26/12  3:45 AM       Result Value Range    Sodium 138  136 - 145 mmol/L    Potassium 3.7  3.5 - 5.1 mmol/L    Chloride 104  97 - 108 mmol/L    CO2 29  21 - 32 mmol/L    Anion gap 5  5 - 15 mmol/L    Glucose 128 (*) 65 - 100 mg/dL    BUN 4 (*) 6 - 20 MG/DL    Creatinine 8.65  7.84 - 1.15 MG/DL    BUN/Creatinine ratio 7 (*) 12 - 20      GFR est AA >60  >60 ml/min/1.51m2    GFR est non-AA >  60  >60 ml/min/1.90m2    Calcium 8.8  8.5 - 10.1 MG/DL   MAGNESIUM    Collection Time     12/26/12  3:45 AM       Result Value Range    Magnesium 1.3 (*) 1.6 - 2.4 mg/dL   METABOLIC PANEL, BASIC    Collection Time     12/27/12  3:30 AM       Result Value Range    Sodium 141  136 - 145 mmol/L    Potassium 4.3  3.5 - 5.1 mmol/L    Chloride 106  97 - 108 mmol/L    CO2 29  21 - 32 mmol/L    Anion gap 6  5 - 15 mmol/L    Glucose 107 (*) 65 - 100 mg/dL    BUN 3 (*) 6 - 20 MG/DL    Creatinine 1.61 (*) 0.45 - 1.15 MG/DL    BUN/Creatinine ratio 10 (*) 12 - 20      GFR est AA >60  >60 ml/min/1.87m2    GFR est non-AA >60  >60 ml/min/1.24m2    Calcium 8.8  8.5 - 10.1 MG/DL   MAGNESIUM    Collection Time     12/27/12  3:30 AM       Result Value Range    Magnesium 1.7  1.6 - 2.4 mg/dL       Data Review images and reports reviewed

## 2012-12-27 NOTE — Other (Signed)
Bedside and Verbal shift change report given to Anne Marie RN (oncoming nurse) by Samantha RN (offgoing nurse). Report included the following information SBAR, Kardex, Intake/Output, MAR, Accordion and Recent Results.

## 2012-12-27 NOTE — Progress Notes (Signed)
Reviewed discharge instructions, medications, and medication education with patient. Patient verbalized understanding. Family also present at bedside for education. Educated both on how to empty and measure JP output. Instructed patient and family not to remove Wheelchair transport arranged to main lobby.

## 2012-12-27 NOTE — Progress Notes (Signed)
DISCHARGE SUMMARY from Nurse    The following personal items are in your possession at time of discharge:    Dental Appliances: None  Visual Aid: Glasses;At home     Home Medications: None  Jewelry: None  Clothing: Footwear;Shirt;Pants;Undergarments  Other Valuables: None  Personal Items Sent to Safe: declined          PATIENT INSTRUCTIONS:    After general anesthesia or intravenous sedation, for 24 hours or while taking prescription Narcotics:  ?? Limit your activities  ?? Do not drive and operate hazardous machinery  ?? Do not make important personal or business decisions  ?? Do  not drink alcoholic beverages  ?? If you have not urinated within 8 hours after discharge, please contact your surgeon on call.    Report the following to your surgeon:  ?? Excessive pain, swelling, redness or odor of or around the surgical area  ?? Temperature over 100.5  ?? Nausea and vomiting lasting longer than 4 hours or if unable to take medications  ?? Any signs of decreased circulation or nerve impairment to extremity: change in color, persistent  numbness, tingling, coldness or increase pain  ?? Any questions        What to do at Home:  Recommended activity: Activity as tolerated      *  Please give a list of your current medications to your Primary Care Provider.    *  Please update this list whenever your medications are discontinued, doses are      changed, or new medications (including over-the-counter products) are added.    *  Please carry medication information at all times in case of emergency situations.          These are general instructions for a healthy lifestyle:    No smoking/ No tobacco products/ Avoid exposure to second hand smoke    Surgeon General's Warning:  Quitting smoking now greatly reduces serious risk to your health.    Obesity, smoking, and sedentary lifestyle greatly increases your risk for illness    A healthy diet, regular physical exercise & weight monitoring are important for maintaining a healthy lifestyle     You may be retaining fluid if you have a history of heart failure or if you experience any of the following symptoms:  Weight gain of 3 pounds or more overnight or 5 pounds in a week, increased swelling in our hands or feet or shortness of breath while lying flat in bed.  Please call your doctor as soon as you notice any of these symptoms; do not wait until your next office visit.    Recognize signs and symptoms of STROKE:    F-face looks uneven    A-arms unable to move or move unevenly    S-speech slurred or non-existent    T-time-call 911 as soon as signs and symptoms begin-DO NOT go       Back to bed or wait to see if you get better-TIME IS BRAIN.        The discharge information has been reviewed with the patient.  The patient verbalized understanding.

## 2012-12-27 NOTE — Progress Notes (Signed)
24 hour post PICC insertion dressing check on right  arm PICC, CDI .Biopatch, stat loc and transparent dressing C/D/I. Will follow up per protocol.  Connie Dean, RN, CRNI, PICC nurse

## 2012-12-27 NOTE — Progress Notes (Signed)
Pt placed in supine position,  Old PICC line dressing removed, PICC line removed, tip intact, firm pressure applied for 5 minutes, pt instructed to lie in supine position for 30 minutes.

## 2012-12-27 NOTE — Discharge Summary (Signed)
Physician Discharge Summary     Patient ID:  Claudia [name]  [id number]  57 y.o.  1955-12-11    Admit Date: 12/23/2012    Discharge Date: 12/27/2012    Admission Diagnoses: COLONIC CANCER ;Colon cancer    Discharge Diagnoses:    Problem List as of 12/27/2012 Date Reviewed: 12/23/2012        ICD-9-CM Class Noted - Resolved    UTI (lower urinary tract infection) 599.0  12/24/2012 - Present        Colon cancer 153.9  12/23/2012 - Present               Admission Condition: Good    Discharge Condition: Good    Last Procedure: Procedure(s):  LAPAROSCOPIC SIGMOID COLECTOMY, CYSTO WITH BILATERAL URETERAL STENT PLACEMENT  CYSTOSCOPY INSERTION URETERAL CATHETERS AND REMOVAL    Hospital Course:   Normal hospital course for this procedure.    Consults: None    Significant Diagnostic Studies: pathology was pending    Disposition: home    Patient Instructions:   Current Discharge Medication List      START taking these medications    Details   HYDROcodone-acetaminophen (NORCO) 5-325 mg per tablet Take 1-2 Tabs by mouth every four (4) hours as needed for Pain.  Qty: 50 Tab, Refills: 0      docusate sodium (COLACE) 100 mg capsule Take 1 Cap by mouth two (2) times a day.  Qty: 30 Cap, Refills: 0      ciprofloxacin (CIPRO) 500 mg tablet Take 1 Tab by mouth two (2) times a day for 10 days.  Qty: 20 Tab, Refills: 0         CONTINUE these medications which have NOT CHANGED    Details   tiotropium (SPIRIVA WITH HANDIHALER) 18 mcg inhalation capsule Take 1 Cap by inhalation daily.      pantoprazole (PROTONIX) 40 mg tablet Take 40 mg by mouth every morning. Indications: GASTROESOPHAGEAL REFLUX      atorvastatin (LIPITOR) 40 mg tablet Take  by mouth every morning. Indications: HYPERCHOLESTEROLEMIA      cholecalciferol (VITAMIN D3) 1,000 unit tablet Take  by mouth daily.      aspirin 81 mg tablet Take 81 mg by mouth daily.      citalopram (CELEXA) 40 mg tablet Take 40 mg by mouth every morning.      trazodone (DESYREL) 150 mg tablet Take 200 mg by  mouth nightly.      benztropine (COGENTIN) 0.5 mg tablet Take 0.5 mg by mouth nightly.      clopidogrel (PLAVIX) 75 mg tablet Take 75 mg by mouth daily.      olanzapine (ZYPREXA) 15 mg tablet Take 15 mg by mouth nightly. Indications: SCHIZOPHRENIA         STOP taking these medications       PEG 3350-Electrolytes (COLYTE;GOLYTELY) solr Comments:   Reason for Stopping:             Activity: No heavy lifting, pushing, pulling for 2 months  Diet: GI lite  Wound Care: As directed    Follow-up with Dr Nedra Hai in 1 week.      Signed:  Sabino Snipes, MD  12/27/2012  2:51 PM

## 2013-01-05 NOTE — Progress Notes (Signed)
Patient is here for follow-up.  Patient underwent Laparoscopic sigmoid colectomy and splenic flexure mobilization on 12/23/12.  Doing well.  JP is draining about 20 cc/day.  Tolerating diet.  Path reviewed with the patient.  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  All 16 nodes negative for carcinoma.  (T2N0M0).    PE:  BP 129/100   Pulse 91   Ht 5\' 4"  (1.626 m)   Wt 73.483 kg (162 lb)   BMI 27.79 kg/m2   SpO2 93%  Abdomen:  Soft, ND.  Inc C/D/I.  JP with serosanguinous drainage.    Assessment:  S/p Laparoscopic sigmoid colectomy, splenic flexure mobilization    Plan:  -D/c JP  -Referral to Dr. Barbette Merino  -RTC in 3 months

## 2013-01-07 NOTE — Progress Notes (Signed)
57 y/o cauc female here for initial appointment with MD for colon cancer. She has had a significant weight loss over 2-3 months about 30-40 lbs. s c/o stomach ache x months. BM's were hard but she has always had constipation Went to PCP who referred for her to get a colonoscopy by Dr. Sherryll Burger and said polyps were noted and he wanted a test, precancerous results noted, waited 3 months and came back  And now the path revealed cancer. Pt says healing from the surgery okay, she says she isn't in any pain right now. ADL's  Are getting back to normal her daughter-in-law, she lives with her.     Ms. Vinje has a reminder for a "due or due soon" health maintenance. I have asked that she contact her primary care provider for follow-up on this health maintenance.

## 2013-01-07 NOTE — Progress Notes (Signed)
History and Physical        Patient: Claudia [name] MRN: [id number]  SSN: [id number]    Date of Birth: 04-12-1956  Age: 57 y.o.  Sex: female      Subjective:      Claudia [name] is a 57 y.o. female who presents for consultation regarding a diagnosis of colon adenocarcinoma. She had been experiencing achy lower abdominal pain on and off for 6 months. She had lost about 30 lbs during this period. The patient had also mentioned some gastroinestinal bleeding. A colonoscopy performed on 12/01/12 found large ulcerated sigmoid mass. The sigmoid colon mass biopsy returned a diagnosis adenocarcinoma. Patient subsequently underwent abd/pelvic CT scan on 10/08/12 which was unremarkable except for diverticulosis. She underwent a hemicolectomy on 12/23/2012. She is doing well. No symptoms. Claudia [name] suffers from Schizophrenia and is accompanied by her daugther-in-law.         Review of Systems:    Constitutional: negative  Eyes: negative  Ears, Nose, Mouth, Throat, and Face: negative  Respiratory: negative  Cardiovascular: negative  Gastrointestinal: negative  Integument/Breast: negative  Hematologic/Lymphatic: negative  Musculoskeletal:negative  Neurological: negative        Past Medical History   Diagnosis Date   ??? Schizophrenia      Dr Hubert Azure on [address]   ??? MI (myocardial infarction) 2006     in [place]   ??? Hypercholesteremia    ??? CAD (coronary artery disease)       stent placed in 2006 [place]; followed by Dr Eugenie Filler   ??? Chronic obstructive pulmonary disease      managed by PCP   ??? Arthritis      unknown type - back   ??? Psychiatric disorder      schizophrenia   ??? Cancer      colon ca     Past Surgical History   Procedure Laterality Date   ??? Pr cardiac surg procedure unlist  2006     stent placed   ??? Hx heent       teeth removal      Family History   Problem Relation Age of Onset   ??? Heart Attack Father    ??? Cancer Mother      breast   ??? Cancer Brother      kidney     History   Substance Use Topics   ??? Smoking status:  Current Every Day Smoker -- 1.50 packs/day for 42 years   ??? Smokeless tobacco: Never Used   ??? Alcohol Use: No      Prior to Admission medications    Medication Sig Start Date End Date Taking? Authorizing Provider   HYDROcodone-acetaminophen (NORCO) 5-325 mg per tablet Take 1-2 Tabs by mouth every four (4) hours as needed for Pain. 12/23/12  Yes Sullivan Lone, MD   docusate sodium (COLACE) 100 mg capsule Take 1 Cap by mouth two (2) times a day. 12/23/12  Yes Sullivan Lone, MD   tiotropium (SPIRIVA WITH HANDIHALER) 18 mcg inhalation capsule Take 1 Cap by inhalation daily.   Yes Historical Provider   pantoprazole (PROTONIX) 40 mg tablet Take 40 mg by mouth every morning. Indications: GASTROESOPHAGEAL REFLUX   Yes Historical Provider   atorvastatin (LIPITOR) 40 mg tablet Take  by mouth every morning. Indications: HYPERCHOLESTEROLEMIA   Yes Historical Provider   cholecalciferol (VITAMIN D3) 1,000 unit tablet Take  by mouth daily.   Yes Historical Provider   aspirin 81 mg tablet  Take 81 mg by mouth daily.   Yes Phys Other, MD   citalopram (CELEXA) 40 mg tablet Take 40 mg by mouth every morning.   Yes Phys Other, MD   trazodone (DESYREL) 150 mg tablet Take 200 mg by mouth nightly.   Yes Phys Other, MD   benztropine (COGENTIN) 0.5 mg tablet Take 0.5 mg by mouth nightly. 06/25/09  Yes Phys Other, MD   clopidogrel (PLAVIX) 75 mg tablet Take 75 mg by mouth daily.   Yes Phys Other, MD   olanzapine (ZYPREXA) 15 mg tablet Take 15 mg by mouth nightly. Indications: SCHIZOPHRENIA   Yes Phys Other, MD              No Known Allergies        Objective:     Filed Vitals:    01/07/13 1445   BP: 112/67   Pulse: 81   Temp: 99.1 ??F (37.3 ??C)   TempSrc: Oral   Resp: 20   Height: 5\' 4"  (1.626 m)   Weight: 162 lb 9.6 oz (73.755 kg)   SpO2: 95%            Physical Exam:    GENERAL: alert, cooperative  EYE: negative  LYMPHATIC: Cervical, supraclavicular, and axillary nodes normal.   THROAT & NECK: normal and no erythema or exudates noted.   LUNG:  clear to auscultation bilaterally  HEART: regular rate and rhythm  ABDOMEN: soft, non-tender  EXTREMITIES: no cyanosis or edema  SKIN: Normal.  NEUROLOGIC: negative              Assessment:     1. Colon adenocarcinoma:   T2 N0 M0 (Stage 1)        This patient has a 93% chance of disease free remission at 5 years of follow up.  There is no role of adjuvant therapy.   Thus I recommend observation alone.   I will obtain a CEA in 3 months and CT abdomen every 6 months for the first 3 years.     I spent 65 minute with the patient in a face-to-face encounter. I explained her the stage of the disease, pathophysiology of the disease and the treatment approaches. I answered all her questions.           Plan:       1. Observation  2. F/U in 3 months with CEA level  3. Repeat colonoscopy within 1 year.         Signed By: Kathaleen Maser, MD     January 07, 2013           CC. Clarise Cruz, MD  CC. Nadyne Coombes, MD  CC. Lynnae Sandhoff, MD

## 2013-04-20 NOTE — Progress Notes (Signed)
Follow up Note        Patient: Claudia [name] MRN: [id number]  SSN: [id number]    Date of Birth: Sep 26, 1955  Age: 57 y.o.  Sex: female          Diagnosis:       1. Colon adenocarcinoma:   T2 N0 M0 (Stage 1)          Subjective:      Claudia [name] is a 57 y.o. female with a diagnosis of colon adenocarcinoma. She underwent a hemicolectomy on 12/23/2012. She is doing well. Denies any symptoms. Claudia [name] suffers from Schizophrenia and is accompanied by her daugther-in-law.           Review of Systems:    Constitutional: negative  Eyes: negative  Ears, Nose, Mouth, Throat, and Face: negative  Respiratory: negative  Cardiovascular: negative  Gastrointestinal: negative  Integument/Breast: negative  Hematologic/Lymphatic: negative  Musculoskeletal:negative  Neurological: negative        Past Medical History   Diagnosis Date   ??? Schizophrenia      Dr Hubert Azure on [address]   ??? MI (myocardial infarction) 2006     in [place]   ??? Hypercholesteremia    ??? CAD (coronary artery disease)       stent placed in 2006 [place]; followed by Dr Eugenie Filler   ??? Chronic obstructive pulmonary disease      managed by PCP   ??? Arthritis      unknown type - back   ??? Psychiatric disorder      schizophrenia   ??? Cancer      colon ca     Past Surgical History   Procedure Laterality Date   ??? Pr cardiac surg procedure unlist  2006     stent placed   ??? Hx heent       teeth removal      Family History   Problem Relation Age of Onset   ??? Heart Attack Father    ??? Cancer Mother      breast   ??? Cancer Brother      kidney     History   Substance Use Topics   ??? Smoking status: Current Every Day Smoker -- 1.50 packs/day for 42 years   ??? Smokeless tobacco: Never Used   ??? Alcohol Use: No      Prior to Admission medications    Medication Sig Start Date End Date Taking? Authorizing Provider   docusate sodium (COLACE) 100 mg capsule Take 1 Cap by mouth two (2) times a day. 12/23/12  Yes Sullivan Lone, MD   tiotropium (SPIRIVA WITH HANDIHALER) 18 mcg inhalation  capsule Take 1 Cap by inhalation daily.   Yes Historical Provider   pantoprazole (PROTONIX) 40 mg tablet Take 40 mg by mouth every morning. Indications: GASTROESOPHAGEAL REFLUX   Yes Historical Provider   atorvastatin (LIPITOR) 40 mg tablet Take  by mouth every morning. Indications: HYPERCHOLESTEROLEMIA   Yes Historical Provider   cholecalciferol (VITAMIN D3) 1,000 unit tablet Take  by mouth daily.   Yes Historical Provider   aspirin 81 mg tablet Take 81 mg by mouth daily.   Yes Phys Other, MD   citalopram (CELEXA) 40 mg tablet Take 40 mg by mouth every morning.   Yes Phys Other, MD   trazodone (DESYREL) 150 mg tablet Take 200 mg by mouth nightly.   Yes Phys Other, MD   benztropine (COGENTIN) 0.5 mg tablet Take 0.5 mg by mouth  nightly. 06/25/09  Yes Phys Other, MD   clopidogrel (PLAVIX) 75 mg tablet Take 75 mg by mouth daily.   Yes Phys Other, MD   olanzapine (ZYPREXA) 15 mg tablet Take 15 mg by mouth nightly. Indications: SCHIZOPHRENIA   Yes Phys Other, MD   HYDROcodone-acetaminophen (NORCO) 5-325 mg per tablet Take 1-2 Tabs by mouth every four (4) hours as needed for Pain. 12/23/12   Sullivan Lone, MD              No Known Allergies        Objective:     Filed Vitals:    04/20/13 1603   BP: 138/80   Pulse: 68   Temp: 98.4 ??F (36.9 ??C)   TempSrc: Oral   Resp: 18   Height: 5\' 4"  (1.626 m)   Weight: 162 lb (73.483 kg)   SpO2: 92%            Physical Exam:    GENERAL: alert, cooperative  EYE: negative  LYMPHATIC: Cervical, supraclavicular, and axillary nodes normal.   THROAT & NECK: normal and no erythema or exudates noted.   LUNG: clear to auscultation bilaterally  HEART: regular rate and rhythm  ABDOMEN: soft, non-tender  EXTREMITIES: no cyanosis or edema  SKIN: Normal.  NEUROLOGIC: negative              Assessment:     1. 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         Plan:       1. Observation  2. CEA level   3. Repeat colonoscopy by Aug 2015         Signed By: Kathaleen Maser, MD     April 20, 2013           CC. Clarise Cruz, MD  CC. Nadyne Coombes, MD  CC. Lynnae Sandhoff, MD

## 2013-04-20 NOTE — Progress Notes (Signed)
57 y/o cauc female here for f/u appt for colon ca, needs cea level. colonscopy due in july 2015. BM regulated with medication. Appetite is good. Denies pain. Denies fevers/chills/night sweats.      Claudia [name] has a reminder for a "due or due soon" health maintenance. I have asked that she contact her primary care provider for follow-up on this health maintenance.  VORB CT ABD W CONTRAST  VORB CEA CBC WITH DIFF CMP

## 2013-04-27 LAB — METABOLIC PANEL, COMPREHENSIVE
A-G Ratio: 2 (ref 1.1–2.5)
ALT (SGPT): 12 IU/L (ref 0–32)
AST (SGOT): 12 IU/L (ref 0–40)
Albumin: 4.5 g/dL (ref 3.5–5.5)
Alk. phosphatase: 89 IU/L (ref 39–117)
BUN/Creatinine ratio: 16 (ref 9–23)
BUN: 12 mg/dL (ref 6–24)
Bilirubin, total: 0.3 mg/dL (ref 0.0–1.2)
CO2: 27 mmol/L (ref 18–29)
Calcium: 10.4 mg/dL — ABNORMAL HIGH (ref 8.7–10.2)
Chloride: 101 mmol/L (ref 97–108)
Creatinine: 0.73 mg/dL (ref 0.57–1.00)
GFR est AA: 105 mL/min/{1.73_m2} (ref >59)
GFR est non-AA: 91 mL/min/{1.73_m2} (ref >59)
GLOBULIN, TOTAL: 2.3 g/dL (ref 1.5–4.5)
Glucose: 109 mg/dL — ABNORMAL HIGH (ref 65–99)
Potassium: 4.9 mmol/L (ref 3.5–5.2)
Protein, total: 6.8 g/dL (ref 6.0–8.5)
Sodium: 144 mmol/L (ref 134–144)

## 2013-04-27 LAB — CEA: CEA: 2.4 ng/mL (ref 0.0–4.7)

## 2013-04-27 LAB — CBC WITH AUTOMATED DIFF
ABS. BASOPHILS: 0 10*3/uL (ref 0.0–0.2)
ABS. EOSINOPHILS: 0.2 10*3/uL (ref 0.0–0.4)
ABS. IMM. GRANS.: 0 10*3/uL (ref 0.0–0.1)
ABS. MONOCYTES: 0.5 10*3/uL (ref 0.1–0.9)
ABS. NEUTROPHILS: 3.8 10*3/uL (ref 1.4–7.0)
Abs Lymphocytes: 1.7 10*3/uL (ref 0.7–3.1)
BASOPHILS: 1 %
EOSINOPHILS: 2 %
HCT: 47.8 % — ABNORMAL HIGH (ref 34.0–46.6)
HGB: 15.5 g/dL (ref 11.1–15.9)
IMMATURE GRANULOCYTES: 0 %
Lymphocytes: 28 %
MCH: 30 pg (ref 26.6–33.0)
MCHC: 32.4 g/dL (ref 31.5–35.7)
MCV: 93 fL (ref 79–97)
MONOCYTES: 8 %
NEUTROPHILS: 61 %
PLATELET: 268 10*3/uL (ref 150–379)
RBC: 5.17 x10E6/uL (ref 3.77–5.28)
RDW: 12.9 % (ref 12.3–15.4)
WBC: 6.3 10*3/uL (ref 3.4–10.8)

## 2013-04-29 ENCOUNTER — Encounter

## 2013-05-10 NOTE — Progress Notes (Signed)
No show for scheduled appointment with Dr Sophia Lee.

## 2013-06-21 NOTE — Progress Notes (Signed)
Note sent to scheduler

## 2013-07-13 NOTE — Progress Notes (Signed)
Patient is here for follow-up and with new complaints.  Patient underwent Laparoscopic sigmoid colectomy and splenic flexure mobilization on 12/23/12.  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  All 16 nodes negative for carcinoma.  (T2N0M0).  No chemotherapy was recommended by Dr. Lanier Clam.  Patient presents today with several week history of intermittent crampy lower abdominal pain without radiation.  No nausea or vomiting.  No F/C/S.  No obvious palliative factors.  No obvious provocative factors other than coffee.  No change in bowel habits.  No diarrhea or constipation.  No dysuria.    PE:  Alert.  NAD.  Abdomen:  Soft, ND.  Inc well healed.  No obvious incisional hernia.  Mild bilateral lower abdominal tenderness.  No rebound or guarding.    Assessment:  S/p Laparoscopic sigmoid colectomy, splenic flexure mobilization    Plan:  -Abd/pelvic CT scan to further evaluate.  -f/u with Dr. Manuella Ghazi for follow up colonoscopy

## 2013-07-17 MED ORDER — BARIUM SULFATE 2 % ORAL SUSP
2.1 % (w/v), 2.0 % (w/w) | Freq: Once | ORAL | Status: AC
Start: 2013-07-17 — End: 2013-07-17
  Administered 2013-07-17: 14:00:00 via ORAL

## 2013-07-17 MED ORDER — IOPAMIDOL 76 % IV SOLN
370 mg iodine /mL (76 %) | Freq: Once | INTRAVENOUS | Status: AC
Start: 2013-07-17 — End: 2013-07-17
  Administered 2013-07-17: 14:00:00 via INTRAVENOUS

## 2013-07-17 MED ORDER — SODIUM CHLORIDE 0.9 % IJ SYRG
Freq: Once | INTRAMUSCULAR | Status: AC
Start: 2013-07-17 — End: 2013-07-17
  Administered 2013-07-17: 14:00:00 via INTRAVENOUS

## 2013-07-17 MED ADMIN — 0.9% sodium chloride infusion: INTRAVENOUS | @ 14:00:00 | NDC 00409798303

## 2013-07-17 MED FILL — ISOVUE-370  76 % INTRAVENOUS SOLUTION: 370 mg iodine /mL (76 %) | INTRAVENOUS | Qty: 100

## 2013-07-17 MED FILL — READI-CAT 2  2.1 % (W/V), 2.0 % (W/W) ORAL SUSPENSION: 2.1 % (w/v), 2.0 % (w/w) | ORAL | Qty: 900

## 2013-07-17 MED FILL — BD POSIFLUSH NORMAL SALINE 0.9 % INJECTION SYRINGE: INTRAMUSCULAR | Qty: 10

## 2013-07-17 MED FILL — SODIUM CHLORIDE 0.9 % IV: INTRAVENOUS | Qty: 1000

## 2013-07-18 NOTE — Telephone Encounter (Signed)
The results of the CT scan discussed with her daughter.

## 2013-08-19 ENCOUNTER — Encounter

## 2013-08-19 NOTE — Progress Notes (Signed)
58 y/o cauc female here for f/u appt for colon ca, ct scan done last month. Needs blood work. BMS slow needs stool softners. Stomach pain on and off going on for a good 4 months, saw surgeon and had a scan done, which everything look find (+) for flatuence.       Claudia [name] has a reminder for a "due or due soon" health maintenance. I have asked that she contact her primary care provider for follow-up on this health maintenance.  VORB CBC WITH DIFF CMP CEA AS ONE TIME ORDER

## 2013-08-21 NOTE — Progress Notes (Signed)
Follow up Note        Patient: Claudia [name] MRN: [id number]  SSN: [id number]    Date of Birth: 10/29/1955  Age: 58 y.o.  Sex: female          Diagnosis:       1. 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 female with a diagnosis of colon adenocarcinoma. She underwent a hemicolectomy on 12/23/2012. She is doing well. Denies any symptoms. Claudia [name] suffers from Schizophrenia and is accompanied by her daugther-in-law.           Review of Systems:    Constitutional: negative  Eyes: negative  Ears, Nose, Mouth, Throat, and Face: negative  Respiratory: negative  Cardiovascular: negative  Gastrointestinal: negative  Integument/Breast: negative  Hematologic/Lymphatic: negative  Musculoskeletal:negative  Neurological: negative        Past Medical History   Diagnosis Date   ??? Schizophrenia ([place])      Dr Beverlee Nims on Lauralyn Primes   ??? MI (myocardial infarction) ([place]) 2006     in [place]   ??? Hypercholesteremia    ??? CAD (coronary artery disease)       stent placed in 2006 ; followed by Dr Henriette Combs   ??? Chronic obstructive pulmonary disease ([place])      managed by PCP   ??? Arthritis      unknown type - back   ??? Psychiatric disorder      schizophrenia   ??? Cancer [facility])      colon ca     Past Surgical History   Procedure Laterality Date   ??? Pr cardiac surg procedure unlist  2006     stent placed   ??? Hx heent       teeth removal      Family History   Problem Relation Age of Onset   ??? Heart Attack Father    ??? Cancer Mother      breast   ??? Cancer Brother      kidney     History   Substance Use Topics   ??? Smoking status: Current Every Day Smoker -- 1.50 packs/day for 42 years   ??? Smokeless tobacco: Never Used   ??? Alcohol Use: No      Prior to Admission medications    Medication Sig Start Date End Date Taking? Authorizing Provider   HYDROcodone-acetaminophen (NORCO) 5-325 mg per tablet Take 1-2 Tabs by mouth every four (4) hours as needed for Pain. 12/23/12   Duffy Bruce, MD    docusate sodium (COLACE) 100 mg capsule Take 1 Cap by mouth two (2) times a day. 12/23/12   Duffy Bruce, MD   tiotropium (SPIRIVA WITH HANDIHALER) 18 mcg inhalation capsule Take 1 Cap by inhalation daily.    Historical Provider   pantoprazole (PROTONIX) 40 mg tablet Take 40 mg by mouth every morning. Indications: GASTROESOPHAGEAL REFLUX    Historical Provider   atorvastatin (LIPITOR) 40 mg tablet Take  by mouth every morning. Indications: HYPERCHOLESTEROLEMIA    Historical Provider   cholecalciferol (VITAMIN D3) 1,000 unit tablet Take  by mouth daily.    Historical Provider   aspirin 81 mg tablet Take 81 mg by mouth daily.    Phys Other, MD   citalopram (CELEXA) 40 mg tablet Take 40 mg by mouth every morning.    Phys Other,  MD   trazodone (DESYREL) 150 mg tablet Take 200 mg by mouth nightly.    Phys Other, MD   benztropine (COGENTIN) 0.5 mg tablet Take 0.5 mg by mouth nightly. 06/25/09   Phys Other, MD   clopidogrel (PLAVIX) 75 mg tablet Take 75 mg by mouth daily.    Phys Other, MD   olanzapine (ZYPREXA) 15 mg tablet Take 15 mg by mouth nightly. Indications: SCHIZOPHRENIA    Phys Other, MD              No Known Allergies        Objective:     Filed Vitals:    08/19/13 1138   BP: 140/80   Pulse: 89   Temp: 97.7 ??F (36.5 ??C)   TempSrc: Oral   Resp: 18   Height: 5\' 4"  (1.626 m)   Weight: 159 lb (72.122 kg)   SpO2: 91%            Physical Exam:    GENERAL: alert, cooperative  EYE: negative  LYMPHATIC: Cervical, supraclavicular, and axillary nodes normal.   THROAT & NECK: normal and no erythema or exudates noted.   LUNG: clear to auscultation bilaterally  HEART: regular rate and rhythm  ABDOMEN: soft, non-tender  EXTREMITIES: no cyanosis or edema  SKIN: Normal.  NEUROLOGIC: negative              Assessment:     1. 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   CT Abd (07/17/2013) - no evidence of disease        Plan:        1. Observation  2. CEA level  3. Repeat colonoscopy by Aug 2015   4. F/U in 6 months        Signed By: Margreta Journey, MD     August 21, 2013           CC. Fransico Michael, MD  CC. Rona Ravens, MD  CC. Rolm Bookbinder, MD

## 2013-09-20 NOTE — Progress Notes (Signed)
Letter sent

## 2013-10-31 NOTE — Anesthesia Pre-Procedure Evaluation (Addendum)
Anesthetic History   No history of anesthetic complications           Review of Systems / Medical History  Patient summary reviewed, nursing notes reviewed and pertinent labs reviewed    Pulmonary    COPD      Smoker (63 pack years)       Neuro/Psych         Neuromuscular disease ([place]) and psychiatric history (Schizophrenia)     Cardiovascular            Past MI and CAD (Fair daily activity)    Exercise tolerance: >4 METS  Comments: Hypercholestrolemia   GI/Hepatic/Renal               Comments: Colon cancer   Endo/Other        Arthritis     Other Findings            Physical Exam    Airway  Mallampati: II  TM Distance: 4 - 6 cm  Neck ROM: normal range of motion   Mouth opening: Normal     Cardiovascular  Regular rate and rhythm,  S1 and S2 normal,  no murmur, click, rub, or gallop  Rhythm: regular  Rate: normal         Dental    Dentition: Edentulous     Pulmonary  Breath sounds clear to auscultation               Abdominal  GI exam deferred       Other Findings            Anesthetic Plan    ASA: 2  Anesthesia type: general          Induction: Intravenous  Anesthetic plan and risks discussed with: Patient

## 2013-11-01 ENCOUNTER — Inpatient Hospital Stay: Payer: MEDICARE

## 2013-11-01 MED ORDER — SODIUM CHLORIDE 0.9 % IJ SYRG
INTRAMUSCULAR | Status: DC | PRN
Start: 2013-11-01 — End: 2013-11-01

## 2013-11-01 MED ORDER — SODIUM CHLORIDE 0.9 % IJ SYRG
Freq: Three times a day (TID) | INTRAMUSCULAR | Status: DC
Start: 2013-11-01 — End: 2013-11-01

## 2013-11-01 MED ORDER — ATROPINE 0.1 MG/ML SYRINGE
0.1 mg/mL | Freq: Once | INTRAMUSCULAR | Status: DC | PRN
Start: 2013-11-01 — End: 2013-11-01

## 2013-11-01 MED ORDER — FLUMAZENIL 0.1 MG/ML IV SOLN
0.1 mg/mL | INTRAVENOUS | Status: DC | PRN
Start: 2013-11-01 — End: 2013-11-01

## 2013-11-01 MED ORDER — FENTANYL CITRATE (PF) 50 MCG/ML IJ SOLN
50 mcg/mL | INTRAMUSCULAR | Status: DC | PRN
Start: 2013-11-01 — End: 2013-11-01

## 2013-11-01 MED ORDER — EPINEPHRINE 0.1 MG/ML SYRINGE
0.1 mg/mL | Freq: Once | INTRAMUSCULAR | Status: DC | PRN
Start: 2013-11-01 — End: 2013-11-01

## 2013-11-01 MED ORDER — MIDAZOLAM 1 MG/ML IJ SOLN
1 mg/mL | INTRAMUSCULAR | Status: DC | PRN
Start: 2013-11-01 — End: 2013-11-01

## 2013-11-01 MED ORDER — PROPOFOL 10 MG/ML IV EMUL
10 mg/mL | INTRAVENOUS | Status: DC
Start: 2013-11-01 — End: 2013-11-01

## 2013-11-01 MED ORDER — SIMETHICONE 40 MG/0.6 ML ORAL DROPS, SUSP
40 mg/0.6 mL | ORAL | Status: DC | PRN
Start: 2013-11-01 — End: 2013-11-01

## 2013-11-01 MED ORDER — NALOXONE 0.4 MG/ML INJECTION
0.4 mg/mL | INTRAMUSCULAR | Status: DC | PRN
Start: 2013-11-01 — End: 2013-11-01

## 2013-11-01 MED ADMIN — propofol (DIPRIVAN) 10 mg/mL injection: INTRAVENOUS | @ 18:00:00 | NDC 00703285604

## 2013-11-01 MED ADMIN — 0.9% sodium chloride infusion: INTRAVENOUS | @ 18:00:00 | NDC 00409798309

## 2013-11-01 MED ADMIN — midazolam (VERSED) injection: INTRAVENOUS | @ 18:00:00 | NDC 63323041112

## 2013-11-01 MED ADMIN — lidocaine (PF) (XYLOCAINE) 20 mg/mL (2 %) injection: INTRAVENOUS | @ 18:00:00 | NDC 63323020805

## 2013-11-01 MED FILL — SODIUM CHLORIDE 0.9 % IV: INTRAVENOUS | Qty: 1000

## 2013-11-01 MED FILL — MIDAZOLAM 1 MG/ML IJ SOLN: 1 mg/mL | INTRAMUSCULAR | Qty: 2

## 2013-11-01 NOTE — Progress Notes (Signed)
Anesthesia reports 170mg  Propofol, 2 mg IV versed given during procedure.  Received report from anesthesia staff on vital signs and status of patient.

## 2013-11-01 NOTE — Procedures (Signed)
[facility]                  Colonoscopy Operative Report    Claudia [name]  [id number]  14-Jul-1955    Procedure Type:   Colonoscopy --screening     Indications:    Personal history of colon cancer (screening only) - V10.05     Pre-operative Diagnosis: see indication above    Post-operative Diagnosis:  See findings below    Operator:  Elmon Else, MD    Referring Provider: [name], MD (General)      Sedation:  MAC anesthesia Propofol    Pre-Procedural Exam:      Airway: clear,  No airway problems anticipated  Heart: RRR, without gallops or rubs  Lungs: clear bilaterally without wheezes, crackles, or rhonchi  Abdomen: soft, nontender, nondistended, bowel sounds present  Mental Status: awake, alert and oriented to person, place and time     Procedure Details:  After informed consent was obtained with all risks and benefits of procedure explained and preoperative exam completed, the patient was taken to the endoscopy suite and placed in the left lateral decubitus position.  Upon sequential sedation as per above, a digital rectal exam was performed .  The Olympus videocolonoscope  was inserted in the rectum and carefully advanced to the cecum, which was identified by the ileocecal valve and appendiceal orifice.  The cecum was identified by the ileocecal valve and appendiceal orifice.  The quality of preparation was good.  The colonoscope was slowly withdrawn with careful evaluation between folds. Retroflexion in the rectum was completed demonstrating internal hemorrhoids.     Findings:   Rectum: Grade 1 internal hemorrhoid(s);  Sigmoid: surgically absent  Descending Colon:     - Diverticulosis  Transverse Colon: normal  Ascending Colon: normal  Cecum: normal  Terminal Ileum: not intubated      Specimen Removed:  none    Complications: None.     EBL:  None.    Impression:    normal colonic mucosa throughout  evidence of prior surgery , sigmoid colectomy   hemorrhoids internal, Moderate in size    Recommendations: --Repeat colonoscopy in 3 years. High fiber diet.  Resume normal medication(s).     Discharge Disposition:  Home in the company of a driver when able to ambulate.      Jeanine Luz, MD  [facility], [address], [place] [address]  [phone]  [email or web address]

## 2013-11-01 NOTE — Progress Notes (Signed)
Dr Gardenia Phlegm spoke with daughter-in-law's mother earlier regarding results of procedure.  Questions answered and discharge instructions given.  VSS.   Pt assisted to BR to dress and attempt to pass flatus.

## 2013-11-01 NOTE — Progress Notes (Signed)
Claudia [name]  05-Jun-1955  [id number]    Situation:  Verbal report received from: Jenness Corner RN  Procedure: Procedure(s):  COLONOSCOPY    Background:    Preoperative diagnosis: Colonoscopy  Postoperative diagnosis: diverticulosis, hemorrhoids    Operator:  Dr. Gardenia Phlegm  Assistant(s): Endoscopy Technician-1: Celine Mans  Endoscopy RN-1: Rayetta Humphrey, RN    Specimens: * No specimens in log *  H. Pylori  no    Assessment:  Intra-procedure medications   Anesthesia gave intra-procedure sedation and medications, see anesthesia flow sheet     Intravenous fluids: NS@ KVO     Vital signs stable     Abdominal assessment: round and soft     Recommendation:  Discharge patient per MD order.  Family or Friend ---daughter-in-law's mother:  Morley Kos  Permission to share finding with family or friend yes

## 2013-11-01 NOTE — H&P (Signed)
Jeanine Luz, MD  [facility], [place], [place] [address]  [phone]  [email or web address]      See office H and P.  No interval change.    Date of Surgery Update:  LIVIAN VANDERBECK was seen and examined.  History and physical has been reviewed. There have been no significant clinical changes since the completion of the originally dated History and Physical.    Signed By: Eugenie Filler, MD, MD     November 01, 2013 2:04 PM

## 2013-11-01 NOTE — Procedures (Signed)
Procedures by Eugenie Filler., MD at 11/01/13 1417                Author: Eugenie Filler., MD  Service: Gastroenterology  Author Type: Physician       Filed: 11/01/13 1419  Date of Service: 11/01/13 1417  Status: Signed          Editor: Eugenie Filler., MD (Physician)            Pre-procedure Diagnoses        1. Personal history of malignant neoplasm of large intestine [V10.05]                           Post-procedure Diagnoses        1. Diverticulosis of colon (without mention of hemorrhage) [562.10]        2. Internal hemorrhoids without mention of complication [824.2]                           Procedures        1. COLORECTAL SCRN; HI RISK IND [[id number] ([place]                        Colonoscopy Operative Report      Claudia [name]   [id number]   Oct 16, 1955      Procedure Type:   Colonoscopy --screening       Indications:    Personal history of colon cancer (screening only) - V10.05       Pre-operative Diagnosis: see indication above      Post-operative Diagnosis:  See findings below      Operator:  Elmon Else, MD      Referring Provider: [name], MD (General)         Sedation:  MAC anesthesia Propofol      Pre-Procedural Exam:        Airway: clear,  No airway problems anticipated   Heart: RRR, without gallops or rubs   Lungs: clear bilaterally without wheezes, crackles, or rhonchi   Abdomen: soft, nontender, nondistended, bowel sounds present   Mental Status: awake, alert and oriented to person, place and time       Procedure Details:  After informed consent was obtained with all risks and benefits of procedure explained and preoperative exam completed,  the patient was taken to the endoscopy suite and placed in the left lateral decubitus position.  Upon sequential sedation as per above, a digital rectal exam was performed .  The Olympus videocolonoscope  was inserted in the  rectum and carefully advanced  to the cecum, which was identified by the ileocecal valve and appendiceal orifice.  The cecum was identified by the ileocecal valve and appendiceal  orifice.  The quality of preparation was good.  The colonoscope was slowly withdrawn with careful evaluation between folds. Retroflexion in the rectum was completed demonstrating internal  hemorrhoids.       Findings:    Rectum: Grade 1 internal hemorrhoid(s);   Sigmoid: surgically absent   Descending Colon:     - Diverticulosis   Transverse Colon: normal   Ascending Colon: normal   Cecum:  normal   Terminal Ileum: not intubated         Specimen Removed:  none      Complications: None.       EBL:  None.      Impression:    normal colonic mucosa throughout   evidence of prior surgery , sigmoid colectomy   hemorrhoids internal, Moderate in size      Recommendations: --Repeat colonoscopy in 3 years. High fiber diet.  Resume normal medication(s).       Discharge Disposition:  Home in the company of a driver when able to ambulate.         Jeanine Luz, MD   [facility], [place], [place] [address]   [phone]   [email or web address]

## 2013-11-02 NOTE — Anesthesia Post-Procedure Evaluation (Signed)
Post-Anesthesia Evaluation and Assessment    Patient: Claudia [name] MRN: [id number]  SSN: [id number]    Date of Birth: Sep 26, 1955  Age: 58 y.o.  Sex: female       Cardiovascular Function/Vital Signs  Visit Vitals   Item Reading   ??? BP 102/66 mmHg   ??? Pulse 59   ??? Resp 15   ??? Ht 5\' 4"  (1.626 m)   ??? Wt 71.668 kg (158 lb)   ??? BMI 27.11 kg/m2   ??? SpO2 94%       Patient is status post general anesthesia for Procedure(s):  COLONOSCOPY.    Nausea/Vomiting: None    Postoperative hydration reviewed and adequate.    Pain:  Pain Scale 1: Numeric (0 - 10) (11/01/13 1444)  Pain Intensity 1: 0 (11/01/13 1444)   Managed    Neurological Status:       At baseline    Mental Status and Level of Consciousness: Alert and oriented     Pulmonary Status:   O2 Device: Room air (11/01/13 1450)   Adequate oxygenation and airway patent    Complications related to anesthesia: None    Post-anesthesia assessment completed. No concerns    Signed By: Perry Mount, MD     November 02, 2013

## 2013-11-03 MED FILL — DIPRIVAN 10 MG/ML INTRAVENOUS EMULSION: 10 mg/mL | INTRAVENOUS | Qty: 170

## 2013-11-03 MED FILL — LIDOCAINE (PF) 20 MG/ML (2 %) IJ SOLN: 20 mg/mL (2 %) | INTRAMUSCULAR | Qty: 40

## 2014-02-02 ENCOUNTER — Encounter

## 2014-02-22 ENCOUNTER — Encounter

## 2014-02-22 ENCOUNTER — Ambulatory Visit: Admit: 2014-02-22 | Discharge: 2014-02-22 | Payer: MEDICARE | Attending: Internal Medicine | Primary: Family Medicine

## 2014-02-22 DIAGNOSIS — C189 Malignant neoplasm of colon, unspecified: Secondary | ICD-10-CM

## 2014-02-22 NOTE — Progress Notes (Signed)
58 y/o cauc female here for f/u appt for colon cancer, colonoscopy CT may be need to be done again, she needs some blood done as well. BM's slow.  SOB on exertion.       Claudia [name] has a reminder for a "due or due soon" health maintenance. I have asked that she contact her primary care provider for follow-up on this health maintenance.  VORB FROM DR Lanier Clam OBTAIN CBC WITH DIFF CMP AND CEA LEVEL

## 2014-02-22 NOTE — Progress Notes (Signed)
Follow up Note        Patient: Claudia [name] MRN: [id number]  SSN: [id number]    Date of Birth: October 21, 1955  Age: 58 y.o.  Sex: female          Diagnosis:       1. 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 female with a diagnosis of colon adenocarcinoma. She underwent a hemicolectomy on 12/23/2012. She is doing well. Denies any symptoms. Claudia [name] suffers from Schizophrenia and has no complaints today. Repeat colonoscopy was normal.        Review of Systems:    Constitutional: negative  Eyes: negative  Ears, Nose, Mouth, Throat, and Face: negative  Respiratory: negative  Cardiovascular: negative  Gastrointestinal: negative  Integument/Breast: negative  Hematologic/Lymphatic: negative  Musculoskeletal:negative  Neurological: negative        Past Medical History   Diagnosis Date   ??? Schizophrenia ([place])      Dr Beverlee Nims on Lauralyn Primes   ??? MI (myocardial infarction) ([place]) 2006     in [place]   ??? Hypercholesteremia    ??? CAD (coronary artery disease)       stent placed in 2006 [place]; followed by Dr Henriette Combs   ??? Chronic obstructive pulmonary disease ([place])      managed by PCP   ??? Arthritis      unknown type - back   ??? Psychiatric disorder      schizophrenia   ??? Cancer [facility])      colon ca     Past Surgical History   Procedure Laterality Date   ??? Pr cardiac surg procedure unlist  2006     stent placed   ??? Hx heent       teeth removal   ??? Colorectal scrn; hi risk ind  11/01/2013            Family History   Problem Relation Age of Onset   ??? Heart Attack Father    ??? Cancer Mother      breast   ??? Cancer Brother      kidney     History   Substance Use Topics   ??? Smoking status: Current Every Day Smoker -- 1.50 packs/day for 42 years   ??? Smokeless tobacco: Never Used   ??? Alcohol Use: No      Prior to Admission medications    Medication Sig Start Date End Date Taking? Authorizing Provider   docusate sodium (COLACE) 100 mg capsule Take 1 Cap by mouth two (2) times  a day. 12/23/12  Yes Duffy Bruce, MD   tiotropium (SPIRIVA WITH HANDIHALER) 18 mcg inhalation capsule Take 1 Cap by inhalation daily.   Yes Historical Provider   pantoprazole (PROTONIX) 40 mg tablet Take 40 mg by mouth every morning. Indications: GASTROESOPHAGEAL REFLUX   Yes Historical Provider   atorvastatin (LIPITOR) 40 mg tablet Take  by mouth every morning. Indications: HYPERCHOLESTEROLEMIA   Yes Historical Provider   cholecalciferol (VITAMIN D3) 1,000 unit tablet Take  by mouth daily.   Yes Historical Provider   aspirin 81 mg tablet Take 81 mg by mouth daily.   Yes Phys Other, MD   citalopram (CELEXA) 40 mg tablet Take 40 mg by mouth every morning.   Yes Phys Other, MD   trazodone (DESYREL) 150 mg tablet Take 200 mg by  mouth nightly.   Yes Phys Other, MD   benztropine (COGENTIN) 0.5 mg tablet Take 0.5 mg by mouth nightly. 06/25/09  Yes Phys Other, MD   clopidogrel (PLAVIX) 75 mg tablet Take 75 mg by mouth daily.   Yes Phys Other, MD   olanzapine (ZYPREXA) 15 mg tablet Take 15 mg by mouth nightly. Indications: SCHIZOPHRENIA   Yes Phys Other, MD   HYDROcodone-acetaminophen (NORCO) 5-325 mg per tablet Take 1-2 Tabs by mouth every four (4) hours as needed for Pain. 12/23/12   Duffy Bruce, MD              No Known Allergies        Objective:     Filed Vitals:    02/22/14 1138   BP: 124/62   Pulse: 74   Temp: 98.2 ??F (36.8 ??C)   TempSrc: Oral   Resp: 16   Height: 5\' 4"  (1.626 m)   Weight: 160 lb 6.4 oz (72.757 kg)   SpO2: 94%            Physical Exam:    GENERAL: alert, cooperative  EYE: negative  LYMPHATIC: Cervical, supraclavicular, and axillary nodes normal.   THROAT & NECK: normal and no erythema or exudates noted.   LUNG: clear to auscultation bilaterally  HEART: regular rate and rhythm  ABDOMEN: soft, non-tender  EXTREMITIES: no cyanosis or edema  SKIN: Normal.  NEUROLOGIC: negative              Assessment:     1. 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   CT Abd (07/17/2013) - no evidence of disease  Colonoscopy (11/01/2013) - no evidence of disease        Plan:       1. Observation  2. Labs today: CEA, CBC w/ diff, CMP  3. Follow-up in 1 year        Signed By: Aurelio Jew, NP     February 22, 2014            Attending Physician Note:     I have reviewed the history, physical examination, assessment and the plan of the care of the patient. I saw the patient with Jenny Reichmann and I participated in the examination and critical decision making. I agree with the note as stated above.    Claudia [name] is a lady with a diagnosis of Stage I colon ca who is asymptomatic and in remission.   I will see her back in 1 yr.         Signed by: Margreta Journey, MD                     February 22, 2014          CC. Fransico Michael, MD  CC. Rona Ravens, MD  CC. Rolm Bookbinder, MD

## 2014-02-23 LAB — CBC WITH AUTOMATED DIFF
ABS. BASOPHILS: 0 10*3/uL (ref 0.0–0.2)
ABS. EOSINOPHILS: 0.1 10*3/uL (ref 0.0–0.4)
ABS. IMM. GRANS.: 0 10*3/uL (ref 0.0–0.1)
ABS. MONOCYTES: 0.5 10*3/uL (ref 0.1–0.9)
ABS. NEUTROPHILS: 5.2 10*3/uL (ref 1.4–7.0)
Abs Lymphocytes: 1.5 10*3/uL (ref 0.7–3.1)
BASOPHILS: 0 %
EOSINOPHILS: 1 %
HCT: 45 % (ref 34.0–46.6)
HGB: 14.9 g/dL (ref 11.1–15.9)
IMMATURE GRANULOCYTES: 0 %
Lymphocytes: 21 %
MCH: 30.7 pg (ref 26.6–33.0)
MCHC: 33.1 g/dL (ref 31.5–35.7)
MCV: 93 fL (ref 79–97)
MONOCYTES: 7 %
NEUTROPHILS: 71 %
PLATELET: 269 10*3/uL (ref 150–379)
RBC: 4.86 x10E6/uL (ref 3.77–5.28)
RDW: 13.4 % (ref 12.3–15.4)
WBC: 7.4 10*3/uL (ref 3.4–10.8)

## 2014-02-23 LAB — METABOLIC PANEL, COMPREHENSIVE
A-G Ratio: 1.9 (ref 1.1–2.5)
ALT (SGPT): 10 IU/L (ref 0–32)
AST (SGOT): 12 IU/L (ref 0–40)
Albumin: 4.3 g/dL (ref 3.5–5.5)
Alk. phosphatase: 75 IU/L (ref 39–117)
BUN/Creatinine ratio: 12 (ref 9–23)
BUN: 9 mg/dL (ref 6–24)
Bilirubin, total: 0.3 mg/dL (ref 0.0–1.2)
CO2: 31 mmol/L — ABNORMAL HIGH (ref 18–29)
Calcium: 10.2 mg/dL (ref 8.7–10.2)
Chloride: 100 mmol/L (ref 97–108)
Creatinine: 0.78 mg/dL (ref 0.57–1.00)
GFR est AA: 96 mL/min/{1.73_m2} (ref >59)
GFR est non-AA: 83 mL/min/{1.73_m2} (ref >59)
GLOBULIN, TOTAL: 2.3 g/dL (ref 1.5–4.5)
Glucose: 89 mg/dL (ref 65–99)
Potassium: 4.8 mmol/L (ref 3.5–5.2)
Protein, total: 6.6 g/dL (ref 6.0–8.5)
Sodium: 142 mmol/L (ref 134–144)

## 2014-02-23 LAB — CEA: CEA: 2.5 ng/mL (ref 0.0–4.7)

## 2014-02-23 NOTE — Progress Notes (Signed)
Quick Note:        Labs are normal.    ______

## 2014-02-24 NOTE — Progress Notes (Signed)
Quick Note:        Informed pt of this.    ______

## 2015-01-02 ENCOUNTER — Encounter

## 2015-01-18 ENCOUNTER — Emergency Department: Admit: 2015-01-18 | Payer: MEDICARE | Primary: Family Medicine

## 2015-01-18 ENCOUNTER — Inpatient Hospital Stay
Admit: 2015-01-18 | Discharge: 2015-01-19 | Disposition: A | Discharge status: Home / Self Care | Payer: MEDICARE | Attending: Emergency Medicine

## 2015-01-18 DIAGNOSIS — R0789 Other chest pain: Secondary | ICD-10-CM

## 2015-01-18 LAB — METABOLIC PANEL, COMPREHENSIVE
A-G Ratio: 1.3 (ref 1.1–2.2)
ALT (SGPT): 19 U/L (ref 12–78)
AST (SGOT): 11 U/L — ABNORMAL LOW (ref 15–37)
Albumin: 3.8 g/dL (ref 3.5–5.0)
Alk. phosphatase: 72 U/L (ref 45–117)
Anion gap: 7 mmol/L (ref 5–15)
BUN/Creatinine ratio: 20 (ref 12–20)
BUN: 15 MG/DL (ref 6–20)
Bilirubin, total: 0.5 MG/DL (ref 0.2–1.0)
CO2: 32 mmol/L (ref 21–32)
Calcium: 9.8 MG/DL (ref 8.5–10.1)
Chloride: 100 mmol/L (ref 97–108)
Creatinine: 0.75 MG/DL (ref 0.55–1.02)
GFR est AA: >60 mL/min/{1.73_m2} (ref >60)
GFR est non-AA: >60 mL/min/{1.73_m2} (ref >60)
Globulin: 2.9 g/dL (ref 2.0–4.0)
Glucose: 89 mg/dL (ref 65–100)
Potassium: 4.9 mmol/L (ref 3.5–5.1)
Protein, total: 6.7 g/dL (ref 6.4–8.2)
Sodium: 139 mmol/L (ref 136–145)

## 2015-01-18 LAB — CBC WITH AUTOMATED DIFF
ABS. BASOPHILS: 0 10*3/uL (ref 0.0–0.1)
ABS. EOSINOPHILS: 0.2 10*3/uL (ref 0.0–0.4)
ABS. LYMPHOCYTES: 1.5 10*3/uL (ref 0.8–3.5)
ABS. MONOCYTES: 0.4 10*3/uL (ref 0.0–1.0)
ABS. NEUTROPHILS: 4.9 10*3/uL (ref 1.8–8.0)
BASOPHILS: 0 % (ref 0–1)
EOSINOPHILS: 2 % (ref 0–7)
HCT: 44.5 % (ref 35.0–47.0)
HGB: 14.8 g/dL (ref 11.5–16.0)
LYMPHOCYTES: 22 % (ref 12–49)
MCH: 30.8 PG (ref 26.0–34.0)
MCHC: 33.3 g/dL (ref 30.0–36.5)
MCV: 92.7 FL (ref 80.0–99.0)
MONOCYTES: 6 % (ref 5–13)
NEUTROPHILS: 70 % (ref 32–75)
PLATELET: 216 10*3/uL (ref 150–400)
RBC: 4.8 M/uL (ref 3.80–5.20)
RDW: 12.4 % (ref 11.5–14.5)
WBC: 7.1 10*3/uL (ref 3.6–11.0)

## 2015-01-18 LAB — EKG, 12 LEAD, INITIAL
Atrial Rate: 59 {beats}/min
Calculated P Axis: 88 degrees
Calculated R Axis: -73 degrees
Calculated T Axis: 73 degrees
P-R Interval: 152 ms
Q-T Interval: 410 ms
QRS Duration: 72 ms
QTC Calculation (Bezet): 405 ms
Ventricular Rate: 59 {beats}/min

## 2015-01-18 LAB — TROPONIN I
Troponin-I, Qt.: <0.04 ng/mL (ref <0.05)
Troponin-I, Qt.: <0.04 ng/mL (ref <0.05)

## 2015-01-18 LAB — CK W/ CKMB & INDEX
CK - MB: 1.7 NG/ML (ref 0.5–3.6)
CK-MB Index: 4.7 — ABNORMAL HIGH (ref 0–2.5)
CK: 36 U/L (ref 26–192)

## 2015-01-18 MED ORDER — NITROGLYCERIN 0.4 MG SUBLINGUAL TAB
0.4 mg | SUBLINGUAL | Status: DC | PRN
Start: 2015-01-18 — End: 2015-01-19

## 2015-01-18 MED ORDER — SODIUM CHLORIDE 0.9 % IJ SYRG
INTRAMUSCULAR | Status: DC | PRN
Start: 2015-01-18 — End: 2015-01-19

## 2015-01-18 MED ORDER — NITROGLYCERIN 2 % TRANSDERMAL OINTMENT
2 % | Freq: Four times a day (QID) | TRANSDERMAL | Status: DC
Start: 2015-01-18 — End: 2015-01-19
  Administered 2015-01-18 – 2015-01-19 (×4): via TOPICAL

## 2015-01-18 MED ORDER — ENOXAPARIN 40 MG/0.4 ML SUB-Q SYRINGE
40 mg/0.4 mL | SUBCUTANEOUS | Status: DC
Start: 2015-01-18 — End: 2015-01-19
  Administered 2015-01-18: 23:00:00 via SUBCUTANEOUS

## 2015-01-18 MED ORDER — SODIUM CHLORIDE 0.9 % IJ SYRG
Freq: Three times a day (TID) | INTRAMUSCULAR | Status: DC
Start: 2015-01-18 — End: 2015-01-19
  Administered 2015-01-18 – 2015-01-19 (×4): via INTRAVENOUS

## 2015-01-18 MED ORDER — METOPROLOL TARTRATE 25 MG TAB
25 mg | Freq: Two times a day (BID) | ORAL | Status: DC
Start: 2015-01-18 — End: 2015-01-19
  Administered 2015-01-19 (×2): via ORAL

## 2015-01-18 MED FILL — NITRO-BID 2 % TRANSDERMAL OINTMENT: 2 % | TRANSDERMAL | Qty: 1

## 2015-01-18 MED FILL — LOVENOX 40 MG/0.4 ML SUBCUTANEOUS SYRINGE: 40 mg/0.4 mL | SUBCUTANEOUS | Qty: 0.4

## 2015-01-18 NOTE — ED Notes (Signed)
Patient up to bathroom with steady gait. Vitals stable. Will continue to monitor.

## 2015-01-18 NOTE — ED Notes (Signed)
Assumed care of patient for shift change. Report received from [place], [place]. Patient alert and oriented x 4 and is left side laying with HOB elevated. She reports having CP at this time at 6/10 as well as a HA 7/10. HA began approximately 30 minutes ago. SB 45 bpm on cardiac monitor. BP WDL. Lungs CTAB. Patient in NAD. Awaiting re-eval by Dr. Heber Carolina. Will discuss patient c/o with provider. Patient call bell in reach. Will continue to monitor.

## 2015-01-18 NOTE — Progress Notes (Signed)
Pt has unsteady gait. Advised pt on importance of bed alarm. Pt refused. Bed alarm refusal on chart. Advised pt to call if she needs to use the bathroom. Pt sts she understands. Will continue to monitor.

## 2015-01-18 NOTE — ED Notes (Signed)
Admission orders received. Will discuss Metoprolol orders with cardiology as patient HR in 40's. Paged on-call MD, Dr. Evelina Bucy.

## 2015-01-18 NOTE — Other (Signed)
TRANSFER - IN REPORT:    Verbal report received from Tenna Child, RN(name) on Claudia [name]  being received from ED(unit) for routine progression of care      Report consisted of patient???s Situation, Background, Assessment and   Recommendations(SBAR).     Information from the following report(s) SBAR, ED Summary, Procedure Summary, MAR and Recent Results was reviewed with the receiving nurse.    Opportunity for questions and clarification was provided.      Assessment completed upon patient???s arrival to unit and care assumed.

## 2015-01-18 NOTE — ED Notes (Signed)
Discussed patient HR and Metoprolol order with Dr. Francesco Runner. Received VO to hold Metoprolol for HR less than 60 bpm. Regular meal served to patient at this time.

## 2015-01-18 NOTE — H&P (Signed)
Pt seen and examined.     Full consult dictated. Plan cath vs stress in AM     Serial enzymes, EKG in AM

## 2015-01-18 NOTE — ED Notes (Signed)
Patient to be admitted per cardiology. Repeat Troponin being drawn by PCT, Wes.

## 2015-01-18 NOTE — ED Notes (Signed)
Patient conversing with her sister via telephone at this time. This RN updated patient's sister, Renaye Rakers, on plan of care and status. Patient requested that her sister be given information any time she calls. Patient remains stable at this time. Still awaiting IP bed assignment.

## 2015-01-18 NOTE — ED Notes (Signed)
Assumed care of pt from [place].  Pt presents to ED with chief complaint of chest pain intermittently since June, this episode started yesterday.  Pt is A&O x 4.  Pt denies any other symptoms at this time.    Pt resting comfortably on the stretcher in a position of comfort.  Pt in no acute distress at this time.  Call bell within reach.  Side rails x 2.  Cardiac monitor x 3.  Stretcher locked in the lowest position. Pt aware of plan to await for MD/PA-C/NP assessment, and pt/family verbalizes understanding.  Will continue to monitor.

## 2015-01-18 NOTE — ED Notes (Signed)
Bedside and verbal report given to Vaughan Basta, RN at shift change for routine progression of care.  Report consisted of patient???s Situation, Background, Assessment and Recommendations(SBAR).  Information from the following report(s) SBAR, ED Summary, [facility] and Recent Results was reviewed with the receiving nurse.  Opportunity for questions and clarification was provided.

## 2015-01-18 NOTE — Progress Notes (Signed)
Pt reconsidered and bed alarm now in place. Will continue to monitor.

## 2015-01-18 NOTE — ED Notes (Signed)
Dr. Francesco Runner, cardiologist, in to assess patient.

## 2015-01-18 NOTE — ED Provider Notes (Signed)
HPI Comments: 59 y.o. female with h/o MI, CAD, and COPD s/p cardiac stent placement in 2006 who presents via EMS to ED C/O dull intermittent CP with associated diaphoresis since June 2016 with sharp CP yesterday and diaphoretic episode with generalized malaise today. Patient states she has been having dull intermittent mid sternal CP with associated diaphoresis last usually 20 minutes at a time over the past several months that typically occur while at rest. Patient states she had a worsened sharp mid sternal non-radiating CP lasting a few seconds yesterday. Patient states she did not feel well and had a diaphoretic episode 1 hour PTA today. Patient states she is concerned her symptoms are worsening. Patient notes she typically takes plavix and aspirin 34m daily including today. Patient states she received an additional 2464mof aspirin by EMS in route. Patient mentions having chronic SOB associated with COPD with no acute changes in SOB today. Patient also mentions having a stress test several years ago that she states showed no acute findings. Patient denies any additional complaints currently in the ED including acute SOB, cough, N/V/D, constipation, abdominal pain, urinary changes, fever, chills, weakness, numbness.    PCP: JOLynnette CaffeyMD    Social hx: + Smoker (1.5ppd x 42 years), - EtOH, - Illicit Drugs    There are no other complaints, changes or physical findings at this time.    The history is provided by the patient.        Past Medical History:   Diagnosis Date   ??? Arthritis      unknown type - back   ??? CAD (coronary artery disease)       stent placed in 2006 [place]; followed by Dr CaHenriette Combs ??? [facility]   ??? Chronic obstructive pulmonary disease ([facility]     managed by PCP   ??? Hypercholesteremia    ??? [place] [place] ??? Psychiatric disorder      schizophrenia   ??? [facility]     Dr LiBeverlee Nimsn LaLauralyn Primes     Past Surgical History:    Procedure Laterality Date   ??? Pr cardiac surg procedure unlist  2006     stent placed   ??? Hx heent       teeth removal   ??? Colorectal scrn; hi risk ind  11/01/2013               Family History:   Problem Relation Age of Onset   ??? Heart Attack Father    ??? Cancer Mother      breast   ??? Cancer Brother      kidney       Social History     Social History   ??? Marital status: WIDOWED     Spouse name: N/A   ??? Number of children: N/A   ??? Years of education: N/A     Occupational History   ??? Not on file.     Social History Main Topics   ??? Smoking status: Current Every Day Smoker     Packs/day: 1.50     Years: 42.00   ??? Smokeless tobacco: Never Used   ??? Alcohol use No   ??? Drug use: No   ??? Sexual activity: No     Other Topics Concern   ??? Not on file  Social History Narrative         ALLERGIES: Review of patient's allergies indicates no known allergies.    Review of Systems   Constitutional: Positive for diaphoresis. Negative for appetite change, chills, fatigue and fever.   HENT: Negative.  Negative for congestion, rhinorrhea, sinus pressure and sore throat.    Eyes: Negative.    Respiratory: Positive for shortness of breath (chronic SOB). Negative for cough, choking, chest tightness and wheezing.    Cardiovascular: Positive for chest pain. Negative for palpitations and leg swelling.   Gastrointestinal: Negative.  Negative for abdominal pain, constipation, diarrhea, nausea and vomiting.   Endocrine: Negative.    Genitourinary: Negative.  Negative for difficulty urinating, dysuria, flank pain and urgency.   Musculoskeletal: Negative.    Skin: Negative.    Neurological: Negative.  Negative for dizziness, speech difficulty, weakness, light-headedness, numbness and headaches.   Psychiatric/Behavioral: Negative.    All other systems reviewed and are negative.    Patient Vitals for the past 12 hrs:   Temp Pulse Resp BP SpO2   01/18/15 1500 - (!) 49 12 127/86 95 %   01/18/15 1400 - (!) 44 (!) 7 104/65 97 %    01/18/15 1300 - (!) 58 13 116/89 95 %   01/18/15 1245 98.1 ??F (36.7 ??C) (!) 52 16 122/53 95 %        Physical Exam   Constitutional: She is oriented to person, place, and time. She appears well-developed and well-nourished. No distress.   HENT:   Head: Normocephalic and atraumatic.   Mouth/Throat: Oropharynx is clear and moist.   Eyes: Conjunctivae and EOM are normal. Pupils are equal, round, and reactive to light.   Neck: Normal range of motion. Neck supple. No JVD present. No tracheal deviation present.   Cardiovascular: Normal rate, regular rhythm and intact distal pulses.  Exam reveals no gallop and no friction rub.    No murmur heard.  Pulmonary/Chest: Effort normal. No stridor. No respiratory distress. She has wheezes.   Breath sounds distant with occasional wheezes  Breathing non-labored   Abdominal: Soft. Bowel sounds are normal. She exhibits no distension and no mass. There is no tenderness. There is no guarding.   Musculoskeletal: Normal range of motion. She exhibits no edema (No lower extremity edema) or tenderness.   No deformity   Neurological: She is alert and oriented to person, place, and time. She has normal strength.   No focal deficits   Skin: Skin is warm, dry and intact. No rash noted.   Psychiatric: She has a normal mood and affect. Her behavior is normal. Judgment and thought content normal.   Nursing note and vitals reviewed.       MDM  Number of Diagnoses or Management Options  Diagnosis management comments:     ASSESSMENT / PLAN:    Ms Cagley is a pleasnt 59y/o caucasian woman with PMhx of CAD (s/[place] PCI [place] ?RCA [place]), COPD, Hyperlipidemia, Schizophrenai (well managed/functional) who presents with intermittent, increasing frequency of substernal chest pressure associated with diaphoresis. Have been increasing over past few months and occur at rest as well. Last ~15-269m.  Releived w/rest/time. Yesterday, had a few  seconds of sharp pain as well. This am, felt no CP but had the same dipahoresis so came to ED.  EMS gave asa 324 on the way. She reports nml stress test [id number] [place]    On exam, normal vitals, pleasant, NAD,  Lungs w/distatn sounds, occasional wheezes, no distress. HR regular, no LE edema or JVD. This sounds like escillating angina/unstable angina.  Could be COPD, pneumonia but seems less likely. GERD less likely too.  -ECG  -CXR  -CBC, CMP  -Trop/CKMB  -ASA given by EMS  -Pain free now  -Will need admission for intpt risk stratification.      Anselm Lis, MD  EM-IM Physician        UPDATE:  -ECG nonischemic  -CXR w/emphysema  -CBC nml  -CMP nml  -Card Enz negative  -Will speak w/cards about admission       Amount and/or Complexity of Data Reviewed  Clinical lab tests: ordered and reviewed  Tests in the radiology section of CPT??: ordered and reviewed  Tests in the medicine section of CPT??: ordered and reviewed  Review and summarize past medical records: yes  Discuss the patient with other providers: yes (Cardiology)  Independent visualization of images, tracings, or specimens: yes    Patient Progress  Patient progress: stable    ED Course       Procedures        EKG interpretation: (Preliminary)  1236  Rhythm: sinus bradycardia; and regular . Rate (approx.): 59; Axis: mild left axis deviation; Large P-waves, Q waves in leads II, III, and avf, non-STEMI, no ischemic changes      CONSULT NOTE  2:54 PM  Melynda Keller, MD spoke with Dr. Francesco Runner  Speciality: Cardiology  Discussed pt's hx, disposition, and available diagnostic and imaging results. Reviewed care plans. Consultant will come evaluate patient.       4:59 PM  Dr. Francesco Runner will admit patient.       LABORATORY TESTS:  Recent Results (from the past 12 hour(s))   EKG, 12 LEAD, INITIAL    Collection Time: 01/18/15 12:36 PM   Result Value Ref Range    Ventricular Rate 59 BPM    Atrial Rate 59 BPM    P-R Interval 152 ms    QRS Duration 72 ms     Q-T Interval 410 ms    QTC Calculation (Bezet) 405 ms    Calculated P Axis 88 degrees    Calculated R Axis -73 degrees    Calculated T Axis 73 degrees    Diagnosis       Sinus bradycardia  Right atrial enlargement  Left axis deviation  Pulmonary disease pattern  Inferior infarct (cited on or before 18-Jan-2015)  Abnormal ECG  When compared with ECG of 27-Aug-2012 12:23,  No significant change was found     CBC WITH AUTOMATED DIFF    Collection Time: 01/18/15  1:08 PM   Result Value Ref Range    WBC 7.1 3.6 - 11.0 K/uL    RBC 4.80 3.80 - 5.20 M/uL    HGB 14.8 11.5 - 16.0 g/dL    HCT 44.5 35.0 - 47.0 %    MCV 92.7 80.0 - 99.0 FL    MCH 30.8 26.0 - 34.0 PG    MCHC 33.3 30.0 - 36.5 g/dL    RDW 12.4 11.5 - 14.5 %    PLATELET 216 150 - 400 K/uL    NEUTROPHILS 70 32 - 75 %    LYMPHOCYTES 22 12 - 49 %    MONOCYTES 6 5 - 13 %    EOSINOPHILS 2 0 - 7 %    BASOPHILS 0 0 - 1 %    ABS. NEUTROPHILS 4.9 1.8 - 8.0 K/UL    ABS.  LYMPHOCYTES 1.5 0.8 - 3.5 K/UL    ABS. MONOCYTES 0.4 0.0 - 1.0 K/UL    ABS. EOSINOPHILS 0.2 0.0 - 0.4 K/UL    ABS. BASOPHILS 0.0 0.0 - 0.1 K/UL   METABOLIC PANEL, COMPREHENSIVE    Collection Time: 01/18/15  1:08 PM   Result Value Ref Range    Sodium 139 136 - 145 mmol/L    Potassium 4.9 3.5 - 5.1 mmol/L    Chloride 100 97 - 108 mmol/L    CO2 32 21 - 32 mmol/L    Anion gap 7 5 - 15 mmol/L    Glucose 89 65 - 100 mg/dL    BUN 15 6 - 20 MG/DL    Creatinine 0.75 0.55 - 1.02 MG/DL    BUN/Creatinine ratio 20 12 - 20      GFR est AA >60 >60 ml/min/1.36m    GFR est non-AA >60 >60 ml/min/1.723m   Calcium 9.8 8.5 - 10.1 MG/DL    Bilirubin, total 0.5 0.2 - 1.0 MG/DL    ALT 19 12 - 78 U/L    AST 11 (L) 15 - 37 U/L    Alk. phosphatase 72 45 - 117 U/L    Protein, total 6.7 6.4 - 8.2 g/dL    Albumin 3.8 3.5 - 5.0 g/dL    Globulin 2.9 2.0 - 4.0 g/dL    A-G Ratio 1.3 1.1 - 2.2     CK W/ CKMB & INDEX    Collection Time: 01/18/15  1:08 PM   Result Value Ref Range    CK 36 26 - 192 U/L    CK - MB 1.7 0.5 - 3.6 NG/ML     CK-MB Index 4.7 (H) 0 - 2.5     TROPONIN I    Collection Time: 01/18/15  1:08 PM   Result Value Ref Range    Troponin-I, Qt. <0.04 <0.05 ng/mL   TROPONIN I    Collection Time: 01/18/15  4:05 PM   Result Value Ref Range    Troponin-I, Qt. <0.04 <0.05 ng/mL       IMAGING RESULTS:  Exam: 2 view chest  ??  Indication: Chest pain.  ??  COMPARISON: June 23, 2011  ??  PA and lateral views demonstrate normal heart size. The patient is on a cardiac  monitor. The lungs are hyperexpanded and clear of an acute process. Linear scar  in the right upper lung zone is unchanged.  ??  There are bilateral carotid artery calcifications.  ??  IMPRESSION  IMPRESSION:  1. Hyperexpanded lungs are unchanged. No pneumonia      MEDICATIONS GIVEN:  Medications   sodium chloride (NS) flush 5-10 mL (not administered)   sodium chloride (NS) flush 5-10 mL (not administered)   nitroglycerin (NITROBID) 2 % ointment 1 Inch (not administered)   nitroglycerin (NITROSTAT) tablet 0.4 mg (not administered)   enoxaparin (LOVENOX) injection 40 mg (not administered)   metoprolol tartrate (LOPRESSOR) tablet 25 mg (not administered)       IMPRESSION:  No diagnosis found. Unstable Angina      PLAN:  1. Admit to cardiology    ADMIT NOTE:   5:00 PM    The patient is being admitted to the hospital by Dr. BrTyson BabinskiThe result of their tests and reasons for admission have been discussed with the patient and/or family. They convey agreement and understanding for the need to be admitted and for their admission diagnosis.       This note  is prepared by Payton Mccallum, acting as Scribe for Melynda Keller, MD                 Melynda Keller, MD  The scribe's documentation has been prepared under my direction and personally reviewed by me in its entirety. I confirm that the note above accurately reflects all work, treatment, procedures, and medical decision making performed by me.

## 2015-01-18 NOTE — H&P (Signed)
Name:       Claudia Claudia [name], Claudia Claudia [name]                  Admitted:    01/18/2015    Account #:  [id number]                     DOB:         1955/09/01  Physician:  Trevor Iha, MD       Age:         59                               HISTORY AND PHYSICAL      CHIEF COMPLAINT: Chest pain.    HISTORY OF PRESENT ILLNESS: The patient is Claudia [name] 59 year old female with Claudia [name]  history of an apparent MI and PTCA and stenting of an unknown vessel in  2006. This occurred when the patient was residing in [place]. The patient  has Claudia [name] history of dyslipidemia and tobacco abuse. She saw Dr. Henriette Combs several  years ago and had Claudia [name] negative nuclear stress test in 2013. She has not seen  Claudia [name] cardiologist for some time.    The patient presented to the ER tonight with complaints of worsening chest  pain. She describes Claudia [name] dull midsternal chest pain not related to exertion.  It started in June but yesterday it became more sharp and frequent. The  symptoms are episodic and last 15 to 20 minutes. There were no other  associated symptoms. The patient came to the [facility] ER where her initial EKG  and cardiac enzymes were negative. I was asked to see the patient for  evaluation.    PAST MEDICAL HISTORY: As noted above, history of COPD and schizophrenia.    PAST SURGICAL HISTORY: Status post partial colon resection due to cancer.    MEDICATIONS ON ADMISSION INCLUDE:  1. Claritin.  2. Promethazine syrup p.r.n.  3. Zetia.  4. Atenolol.  5. Colace.  6. Spiriva.  7. Lipitor.  8. Plavix.  9. Vitamin D3.  10. Aspirin.  11. Celexa.  12. Trazodone.  13. Cogentin.  14. Zyprexa.    SOCIAL HISTORY: The patient is Claudia [name] smoker. She does not abuse alcohol. She is  Claudia [name] widow.    FAMILY HISTORY: The patient's father had Claudia [name] heart attack.    REVIEW OF SYSTEMS: As noted above. No fever and no chills. Positive recent  weight loss. No melena, no hematochezia and no recent GI bleeding. No  thyroid problems. No diabetes. Positive chronic cough. No nausea or   vomiting. No unusual back pain. No history of pulmonary embolism. No  strokes. No TIAs.    PHYSICAL EXAMINATION  GENERAL: Reveals Claudia [name] late middle-aged white female in no obvious distress.  VITAL SIGNS: Blood pressure 127/86, pulse 50, respirations 12 and  temperature 98.1.  HEENT: Pupils are equal and reactive. Oropharynx with moist oral mucosa.  NECK: Supple. No masses or thyromegaly. No cervical or supraclavicular  adenopathy. No carotid bruits. No JVD.  CHEST: Clear. No wheezes or crackles.  SKIN: Warm and dry.  BACK: No scoliosis.  CARDIAC: Regular rate and rhythm. No obvious murmurs, rubs or gallops.  ABDOMEN: Obese, soft, nontender, no masses or organomegaly. Bowel sounds  positive.  EXTREMITIES: No cyanosis, clubbing, or edema. Distal pulses 1+ in the feet  bilaterally.  NEUROLOGIC: No obvious gross motor deficits.    LABORATORY DATA: Hemoglobin 14.8,  potassium 4.9, BUN 15, creatinine 0.7.  Troponin is negative.    EKG: Sinus rhythm, small inferior Q-waves, nonspecific ST-T changes.    Chest x-ray shows hyperexpanded lungs, otherwise unremarkable.    IMPRESSION:  1. Chest pain suspicious for unstable angina.  2. History of coronary artery disease with prior percutaneous transluminal  coronary angioplasty and stenting of an unknown vessel in 2006.  3. Tobacco abuse.  4. Dyslipidemia.  5. Chronic obstructive pulmonary disease.  6. Schizophrenia.    PLAN: The patient will be admitted. We will repeat cardiac enzymes. We will  place the patient on topical nitrates, beta-blocker and continue aspirin  and Plavix. We will repeat an EKG and cardiac enzymes. Depending on the  patient's course overnight and results of the above workup, will plan on  catheterization versus noninvasive risk stratification.        Reviewed on 01/18/2015 6:21 PM                Trevor Iha, MD    cc:                       Trevor Iha, MD      BKH/wmx; D: 01/18/2015 04:37 P; T: 01/18/2015 05:06 P; DOC# [id number]; Job#   [id number]

## 2015-01-18 NOTE — ED Notes (Signed)
TRANSFER - OUT REPORT:    Verbal report given to Lelon Frohlich, RN on [name]  being transferred to [facility] for routine progression of care       Report consisted of patient???s Situation, Background, Assessment and   Recommendations(SBAR).     Information from the following report(s) SBAR, ED Summary, Procedure Summary, MAR, Recent Results and Cardiac Rhythm SB was reviewed with the receiving nurse.    Lines:   Peripheral IV 01/18/15 Right Antecubital (Active)   Site Assessment Clean, dry, & intact 01/18/2015  9:06 PM   Phlebitis Assessment 0 01/18/2015  9:06 PM   Infiltration Assessment 0 01/18/2015  9:06 PM   Dressing Status Clean, dry, & intact 01/18/2015  9:06 PM   Dressing Type Transparent 01/18/2015  9:06 PM   Hub Color/Line Status Pink;Flushed 01/18/2015  9:06 PM        Opportunity for questions and clarification was provided.      Patient transported with:   Monitor  Registered Nurse

## 2015-01-19 LAB — TROPONIN I
Troponin-I, Qt.: <0.04 ng/mL (ref <0.05)
Troponin-I, Qt.: <0.04 ng/mL (ref <0.05)

## 2015-01-19 LAB — EKG, 12 LEAD, INITIAL
Atrial Rate: 53 {beats}/min
Calculated P Axis: 85 degrees
Calculated R Axis: -41 degrees
Calculated T Axis: 75 degrees
P-R Interval: 150 ms
Q-T Interval: 444 ms
QRS Duration: 76 ms
QTC Calculation (Bezet): 416 ms
Ventricular Rate: 53 {beats}/min

## 2015-01-19 LAB — LIPID PANEL
CHOL/HDL Ratio: 3.7 (ref 0–5.0)
Cholesterol, total: 146 MG/DL (ref <200)
HDL Cholesterol: 39 MG/DL
LDL, calculated: 82.2 MG/DL (ref 0–100)
Triglyceride: 124 MG/DL (ref <150)
VLDL, calculated: 24.8 MG/DL

## 2015-01-19 MED ORDER — ATORVASTATIN 40 MG TAB
40 mg | ORAL | Status: DC
Start: 2015-01-19 — End: 2015-01-19
  Administered 2015-01-19: 13:00:00 via ORAL

## 2015-01-19 MED ORDER — MIDAZOLAM 1 MG/ML IJ SOLN
1 mg/mL | INTRAMUSCULAR | Status: AC
Start: 2015-01-19 — End: 2015-01-19
  Administered 2015-01-19: 16:00:00 via INTRAVENOUS

## 2015-01-19 MED ORDER — TRAZODONE 100 MG TAB
100 mg | Freq: Every evening | ORAL | Status: DC
Start: 2015-01-19 — End: 2015-01-19
  Administered 2015-01-19: 03:00:00 via ORAL

## 2015-01-19 MED ORDER — TRAZODONE 100 MG TAB
100 mg | Freq: Every evening | ORAL | Status: DC
Start: 2015-01-19 — End: 2015-01-19

## 2015-01-19 MED ORDER — FLU VACCINE QV 2016-17 (36 MOS+)(PF) 60 MCG (15 MCGX4)/0.5 ML IM SYRINGE
60 mcg (15 mcg x 4)/0.5 mL | INTRAMUSCULAR | Status: DC
Start: 2015-01-19 — End: 2015-01-19

## 2015-01-19 MED ORDER — BENZTROPINE 1 MG TAB
1 mg | Freq: Every day | ORAL | Status: DC
Start: 2015-01-19 — End: 2015-01-19
  Administered 2015-01-19: 13:00:00 via ORAL

## 2015-01-19 MED ORDER — OLANZAPINE 5 MG TAB
5 mg | Freq: Every evening | ORAL | Status: DC
Start: 2015-01-19 — End: 2015-01-19
  Administered 2015-01-19: 04:00:00 via ORAL

## 2015-01-19 MED ORDER — HEPARIN (PORCINE) IN NS (PF) 1,000 UNIT/500 ML IV
1000 unit/500 mL | Freq: Once | INTRAVENOUS | Status: AC
Start: 2015-01-19 — End: 2015-01-19
  Administered 2015-01-19: 16:00:00

## 2015-01-19 MED ORDER — IOPAMIDOL 76 % IV SOLN
370 mg iodine /mL (76 %) | INTRAVENOUS | Status: DC | PRN
Start: 2015-01-19 — End: 2015-01-19

## 2015-01-19 MED ORDER — FENTANYL CITRATE (PF) 50 MCG/ML IJ SOLN
50 mcg/mL | INTRAMUSCULAR | Status: DC | PRN
Start: 2015-01-19 — End: 2015-01-19
  Administered 2015-01-19: 16:00:00 via INTRAVENOUS

## 2015-01-19 MED ORDER — TIOTROPIUM BROMIDE 18 MCG CAPS WITH INHALATION DEVICE
18 mcg | Freq: Every day | RESPIRATORY_TRACT | Status: DC
Start: 2015-01-19 — End: 2015-01-19
  Administered 2015-01-19: 13:00:00 via RESPIRATORY_TRACT

## 2015-01-19 MED ORDER — IOPAMIDOL 76 % IV SOLN
370 mg iodine /mL (76 %) | INTRAVENOUS | Status: AC
Start: 2015-01-19 — End: 2015-01-19
  Administered 2015-01-19: 16:00:00 via INTRAVENOUS

## 2015-01-19 MED ORDER — HEPARIN (PORCINE) IN NS (PF) 1,000 UNIT/500 ML IV
1000 unit/500 mL | Freq: Once | INTRAVENOUS | Status: AC
Start: 2015-01-19 — End: 2015-01-19

## 2015-01-19 MED ORDER — LIDOCAINE HCL 1 % (10 MG/ML) IJ SOLN
10 mg/mL (1 %) | INTRAMUSCULAR | Status: DC | PRN
Start: 2015-01-19 — End: 2015-01-19

## 2015-01-19 MED ORDER — EZETIMIBE 10 MG TAB
10 mg | Freq: Every day | ORAL | Status: DC
Start: 2015-01-19 — End: 2015-01-19
  Administered 2015-01-19: 13:00:00 via ORAL

## 2015-01-19 MED ORDER — FENTANYL CITRATE (PF) 50 MCG/ML IJ SOLN
50 mcg/mL | INTRAMUSCULAR | Status: AC
Start: 2015-01-19 — End: 2015-01-19
  Administered 2015-01-19: 16:00:00 via INTRAVENOUS

## 2015-01-19 MED ORDER — LORATADINE 10 MG TAB
10 mg | Freq: Every day | ORAL | Status: DC
Start: 2015-01-19 — End: 2015-01-19
  Administered 2015-01-19: 13:00:00 via ORAL

## 2015-01-19 MED ORDER — HEPARIN (PORCINE) IN NS (PF) 1,000 UNIT/500 ML IV
1000 unit/500 mL | INTRAVENOUS | Status: AC
Start: 2015-01-19 — End: 2015-01-19
  Administered 2015-01-19: 16:00:00

## 2015-01-19 MED ORDER — ALBUTEROL SULFATE 0.083 % (0.83 MG/ML) SOLN FOR INHALATION
2.5 mg /3 mL (0.083 %) | Freq: Four times a day (QID) | RESPIRATORY_TRACT | Status: DC
Start: 2015-01-19 — End: 2015-01-19
  Administered 2015-01-19 (×2): via RESPIRATORY_TRACT

## 2015-01-19 MED ORDER — CITALOPRAM 20 MG TAB
20 mg | ORAL | Status: DC
Start: 2015-01-19 — End: 2015-01-19
  Administered 2015-01-19: 13:00:00 via ORAL

## 2015-01-19 MED ORDER — DOCUSATE SODIUM 100 MG CAP
100 mg | Freq: Two times a day (BID) | ORAL | Status: DC
Start: 2015-01-19 — End: 2015-01-19
  Administered 2015-01-19 (×3): via ORAL

## 2015-01-19 MED ORDER — MIDAZOLAM 1 MG/ML IJ SOLN
1 mg/mL | INTRAMUSCULAR | Status: DC | PRN
Start: 2015-01-19 — End: 2015-01-19

## 2015-01-19 MED ORDER — BENZTROPINE 1 MG TAB
1 mg | Freq: Every evening | ORAL | Status: DC
Start: 2015-01-19 — End: 2015-01-18
  Administered 2015-01-19: 02:00:00 via ORAL

## 2015-01-19 MED ORDER — CLOPIDOGREL 75 MG TAB
75 mg | Freq: Every day | ORAL | Status: DC
Start: 2015-01-19 — End: 2015-01-19
  Administered 2015-01-19: 13:00:00 via ORAL

## 2015-01-19 MED ORDER — ASPIRIN 81 MG CHEWABLE TAB
81 mg | Freq: Every day | ORAL | Status: DC
Start: 2015-01-19 — End: 2015-01-19
  Administered 2015-01-19: 13:00:00 via ORAL

## 2015-01-19 MED ORDER — LIDOCAINE HCL 1 % (10 MG/ML) IJ SOLN
10 mg/mL (1 %) | INTRAMUSCULAR | Status: AC
Start: 2015-01-19 — End: 2015-01-19
  Administered 2015-01-19: 16:00:00 via INTRADERMAL

## 2015-01-19 MED FILL — CLOPIDOGREL 75 MG TAB: 75 mg | ORAL | Qty: 1

## 2015-01-19 MED FILL — OLANZAPINE 5 MG TAB: 5 mg | ORAL | Qty: 1

## 2015-01-19 MED FILL — ZETIA 10 MG TABLET: 10 mg | ORAL | Qty: 1

## 2015-01-19 MED FILL — CHILDREN'S ASPIRIN 81 MG CHEWABLE TABLET: 81 mg | ORAL | Qty: 1

## 2015-01-19 MED FILL — CITALOPRAM 20 MG TAB: 20 mg | ORAL | Qty: 2

## 2015-01-19 MED FILL — SPIRIVA WITH HANDIHALER 18 MCG AND INHALATION CAPSULES: 18 mcg | RESPIRATORY_TRACT | Qty: 5

## 2015-01-19 MED FILL — DOK 100 MG CAPSULE: 100 mg | ORAL | Qty: 1

## 2015-01-19 MED FILL — NITRO-BID 2 % TRANSDERMAL OINTMENT: 2 % | TRANSDERMAL | Qty: 1

## 2015-01-19 MED FILL — ALBUTEROL SULFATE 0.083 % (0.83 MG/ML) SOLN FOR INHALATION: 2.5 mg /3 mL (0.083 %) | RESPIRATORY_TRACT | Qty: 1

## 2015-01-19 MED FILL — METOPROLOL TARTRATE 25 MG TAB: 25 mg | ORAL | Qty: 1

## 2015-01-19 MED FILL — LORATADINE 10 MG TAB: 10 mg | ORAL | Qty: 1

## 2015-01-19 MED FILL — ISOVUE-370  76 % INTRAVENOUS SOLUTION: 370 mg iodine /mL (76 %) | INTRAVENOUS | Qty: 150

## 2015-01-19 MED FILL — ATORVASTATIN 40 MG TAB: 40 mg | ORAL | Qty: 1

## 2015-01-19 MED FILL — BENZTROPINE 1 MG TAB: 1 mg | ORAL | Qty: 1

## 2015-01-19 MED FILL — LIDOCAINE HCL 1 % (10 MG/ML) IJ SOLN: 10 mg/mL (1 %) | INTRAMUSCULAR | Qty: 20

## 2015-01-19 MED FILL — FENTANYL CITRATE (PF) 50 MCG/ML IJ SOLN: 50 mcg/mL | INTRAMUSCULAR | Qty: 2

## 2015-01-19 MED FILL — TRAZODONE 100 MG TAB: 100 mg | ORAL | Qty: 2

## 2015-01-19 MED FILL — ISOVUE-370  76 % INTRAVENOUS SOLUTION: 370 mg iodine /mL (76 %) | INTRAVENOUS | Qty: 50

## 2015-01-19 MED FILL — MIDAZOLAM 1 MG/ML IJ SOLN: 1 mg/mL | INTRAMUSCULAR | Qty: 2

## 2015-01-19 MED FILL — HEPARIN (PORCINE) IN NS (PF) 1,000 UNIT/500 ML IV: 1000 unit/500 mL | INTRAVENOUS | Qty: 1500

## 2015-01-19 NOTE — Progress Notes (Signed)
Discharge instructions explained to the patient and to the patient's family members present in the room. Concerns and questions addressed. The patient is ambulatory without complaints. Right groin DDI all pulses palpable. The patient was discharged via wheelchair.

## 2015-01-19 NOTE — Progress Notes (Signed)
PreOp Dx:      CAD       Valvular HD     PostOp Dx: Same    Procedure:       LHC       LVgram        Coronary Angiography                   RHC        Renal Angiography      Aortography    LV:  EF:   Normal   Mildly Reduced     Moderately Reduced     Severly Reduced           MR:    None      Mild     Moderate     Severe    LM:   Normal     Mild Stenosis     Moderate Stenosis    Severe Stenosis    LAD:   Normal     Mild Stenosis     Moderate Stenosis    Severe Stenosis    Circ:   Normal     Mild Stenosis     Moderate Stenosis    Severe Stenosis    RCA:   Normal     Mild Stenosis     Moderate Stenosis    Severe Stenosis    Aorta:      No dissection or significant aneurysm.    No specimens removed.  Minimal blood loss.  No apparent complications.    Plan:    Medical therapy     PCI              Surgical Consult   For other plans, see orders.    Tobey Grim, MD

## 2015-01-19 NOTE — Discharge Summary (Signed)
Name:       Claudia [name], Claudia [name]                  Admitted:    01/18/2015                                               Discharged:  01/18/2015  Account #:  [id number]                     DOB:         1956/01/09  Consultant: Trevor Iha, MD       Age          59                                 DISCHARGE SUMMARY      [place]  1. Chest pain, probably musculoskeletal, resolved.  2. Coronary artery disease with prior myocardial infarction, percutaneous  transluminal coronary angioplasty and stenting of the right coronary  artery, no significant progression by catheterization.  3. Tobacco abuse.  4. Dyslipidemia.  5. Chronic obstructive pulmonary disease.  6. Schizophrenia.    ADMISSION H AND P:  Please see the separately dictated note by Dr.  Francesco Runner.    HOSPITAL COURSE: The patient was admitted. She ruled out for myocardial  infarction overnight. She had no further chest discomfort. Lipid profile  revealed [name] triglyceride level of 124, HDL of 39, and LDL of 82. The  patient's chest x-ray was unremarkable. The following day, it was felt that  cardiac catheterization was the best choice to definitively rule out  progression of coronary disease. The patient had not had [name] recent  catheterization and had prior stress test with episodes of chest pain, all  of which were unremarkable. The patient was taken to the cardiac  catheterization lab. Coronary angiography revealed diffuse mild to moderate  disease with no high-grade stenoses. The previously stented area in the  right coronary artery was widely patent. EF was normal and filling  pressures were normal.    The patient tolerated the procedure well. She ambulated later that day and  was felt to be stable for discharge. It was felt that the patient's chest  discomfort was probably due to musculoskeletal causes in light of the  catheterization findings.    DISCHARGE MEDICATIONS  1. Trazodone 100 mg at bedtime.  2. Zetia 5 mg daily.   3. Colace 100 mg b.i.d.  3. Spiriva inhaler 1 capsule daily.  4. Atorvastatin 40 mg daily.  5. Vitamin D3, 1 daily.  6. Aspirin 81 mg daily.  7. Celexa 40 mg daily.  8. Cogentin 0.5 mg at bedtime.  9. Plavix 75 mg daily.  10. Zyprexa 15 mg at bedtime.  11. Claritin 10 mg daily.  12. Nicotine lozenges as needed.  13. Promethazine cough syrup as needed.  14. Albuterol nebulizer every 6 hours as needed.    FOLLOWUP: The patient was to followup with Dr. Francesco Runner approximately 2-3  weeks post discharge and was also to followup with her primary care  physician, Dr. Nehemiah Massed.              Trevor Iha, MD    cc:    Trevor Iha, MD  BKH/wmx; D: 01/19/2015 07:19 P; T: 01/20/2015 01:28 P; DOC# [id number]; Job#  [id number]

## 2015-01-19 NOTE — Procedures (Signed)
[place] [facility]                        [place], [place]      Name:      Claudia [name], Claudia [name]                Service Date:   01/19/2015  DOB:       26-Mar-1956                     Ordered by:                                            Location:  Sex:       F                              Age:            59  Billing #: [id number]                   Date of Adm:    01/18/2015                               CORONARY ANGIOGRAPHY      PROCEDURES  1. Left heart catheterization.  2. Coronary angiography.  3. Left ventriculography.    OPERATOR: Tobey Grim, MD.    INDICATION: Unstable angina.    CATHETERIZATION NUMBER: [id number].    TECHNIQUE: Right femoral artery via Judkins.    CATHETERS USED: 5-French JL4, 5-French JR4, 5-French pigtail.    MEDICATIONS USED: Please see the separate cath lab log sheet.    COMPLICATIONS: None.    ESTIMATED BLOOD LOSS: Less than 30 mL.    SPECIMENS REMOVED: None.    FINDINGS  1. HEMODYNAMICS: Aortic pressure was 119/56 with a mean of 80. Left  ventricular pressure was 111/2 with an LVEDP of 5. There is no significant  gradient on pullback across the aortic valve.  2. LEFT VENTRICULOGRAPHY: Done from the RAO view revealed a normal size  left ventricle with low-normal systolic function. Estimated EF of 50% to  55%. No regional wall motion abnormalities are seen. No significant mitral  regurgitation was seen.  3. CORONARY ANGIOGRAPHY: Revealed a right dominant system. The left main  was a short vessel without significant disease. The LAD was a  moderate-sized vessel wrapping around the apex. There was a large diagonal  branch off the mid to proximal LAD without significant disease. The LAD  itself was somewhat small in caliber, particularly in its distal portion,  but there were no high-grade stenoses present. There were several other   very small diagonal branches off the mid and distal LAD. The left  circumflex was moderate in size. It gave off a large first marginal vessel  with multiple subbranches. This vessel had no significant disease. OM2 was  a moderate-sized vessel without significant disease. The terminal  circumflex gave off 2 small posterolateral branches, which were quite small  in caliber but had no  high-grade focal stenosis. The distal AV groove  circumflex was small in caliber, and the AV groove circumflex itself had  mild luminal irregularities, but no high-grade focal stenoses. The right  coronary artery was a moderate-sized dominant vessel. There was a 30% area  of narrowing in the midportion of this vessel. The distal right coronary  artery had a focal 30% area of stenosis, followed by a previously stented  segment, which was widely patent. The distal right coronary artery gave off  a PDA with a very small posterolateral branch. The PDA was small in caliber  with no high-grade focal stenoses. The posterolateral branch was quite  small in caliber as well, without significant disease.    CONCLUSIONS  1. No significant obstructive coronary artery disease.  2. The previously stented distal right coronary artery was widely patent.  3. Low-normal left ventricular systolic function without wall motion  abnormalities.  4. Normal resting left ventricular end-diastolic pressure.    RECOMMENDATIONS: Medical therapy and treatment for noncardiac causes of  chest pain.        Reviewed on 01/22/2015 9:48 AM            Trevor Iha, MD    cc:    Trevor Iha, MD    BKH/wmx; D:  01/21/2015 08:28 P; T:  01/22/2015 04:10 A; DOC# [id number]; JOB#  [id number]

## 2015-01-19 NOTE — Progress Notes (Signed)
Pt is a 59 y.o caucasian female admitted with Unstable Angina. Pt was alert, oriented and in no distress. Pt verified her demographic information and all is correct. Pt lives with her son, daughter in law and her 2 grandchildren (ages 70 & 71) in a 1 story home with 2 steps to the entrance. Prior to admission pt was independent with her ADL's and IADL's. Pt doesn't drive and her daughter in law and son transport her. Pt doesn't use any DME. Pt had home health from [facility] two years ago and had a good experience. [facility] uses [facility] on [address]. Pt's son can drive pt home at discharge.     Care Management Interventions  PCP Verified by CM: Yes (Dr. Nehemiah Massed - May 2016)  Last Visit to PCP: 09/18/14  Palliative Care Consult: No  Mode of Transport at Discharge: Other (see comment) (son will transport pt home)  Transition of Care Consult (CM Consult): Discharge Planning  Discharge Durable Medical Equipment: No  Physical Therapy Consult: No  Occupational Therapy Consult: No  Speech Therapy Consult: No  Current Support Network: Other, Own Home (pt lives with son in a 1 story home with 2 steps to the entrance)  Confirm Follow Up Transport: Family  Discharge Location  Discharge Placement: [place], [place]

## 2015-01-19 NOTE — Other (Signed)
CP REHAB NOTE    Chart Review: Admitted for unstable angina  Medical History: CAD, COPD, hypercholesteremia, MI, stents (2006)   Current Every Day Smoker    Cath or stress test planned for today. Will continue to follow for teaching as the need arises.

## 2015-01-19 NOTE — Progress Notes (Signed)
Cath ok. Disc results with pt.     Ambulating, suspect musculoskeletal pain.     D/C home. F/u 2-3 weeks.

## 2015-01-20 MED FILL — ALBUTEROL SULFATE 0.083 % (0.83 MG/ML) SOLN FOR INHALATION: 2.5 mg /3 mL (0.083 %) | RESPIRATORY_TRACT | Qty: 1

## 2015-01-22 NOTE — Procedures (Signed)
[place] [facility]                        [place], [place]      Name:      Claudia [name], Claudia [name]                Service Date:   01/19/2015  DOB:       1956/04/09                     Ordered by:                                            Location:  Sex:       F                              Age:            59  Billing #: [id number]                   Date of Adm:    01/18/2015                               CORONARY ANGIOGRAPHY      PROCEDURES  1. Left heart catheterization.  2. Coronary angiography.  3. Left ventriculography.    OPERATOR: Tobey Grim, MD.    INDICATION: Unstable angina.    CATHETERIZATION NUMBER: [id number].    TECHNIQUE: Right femoral artery via Judkins.    CATHETERS USED: 5-French JL4, 5-French JR4, 5-French pigtail.    MEDICATIONS USED: Please see the separate cath lab log sheet.    COMPLICATIONS: None.    ESTIMATED BLOOD LOSS: Less than 30 mL.    SPECIMENS REMOVED: None.    FINDINGS  1. HEMODYNAMICS: Aortic pressure was 119/56 with a mean of 80. Left  ventricular pressure was 111/2 with an LVEDP of 5. There is no significant  gradient on pullback across the aortic valve.  2. LEFT VENTRICULOGRAPHY: Done from the RAO view revealed a normal size  left ventricle with low-normal systolic function. Estimated EF of 50% to  55%. No regional wall motion abnormalities are seen. No significant mitral  regurgitation was seen.  3. CORONARY ANGIOGRAPHY: Revealed a right dominant system. The left main  was a short vessel without significant disease. The LAD was a  moderate-sized vessel wrapping around the apex. There was a large diagonal  branch off the mid to proximal LAD without significant disease. The LAD  itself was somewhat small in caliber, particularly in its distal portion,  but there were no high-grade stenoses present. There were several other  very small diagonal branches off the mid and distal LAD.  The left  circumflex was moderate in size. It gave off a large first marginal vessel  with multiple subbranches. This vessel had no significant disease. OM2 was  a moderate-sized vessel without significant disease. The terminal  circumflex gave off 2 small posterolateral branches, which were quite small  in caliber but had no  high-grade focal stenosis. The distal AV groove  circumflex was small in caliber, and the AV groove circumflex itself had  mild luminal irregularities, but no high-grade focal stenoses. The right  coronary artery was a moderate-sized dominant vessel. There was a 30% area  of narrowing in the midportion of this vessel. The distal right coronary  artery had a focal 30% area of stenosis, followed by a previously stented  segment, which was widely patent. The distal right coronary artery gave off  a PDA with a very small posterolateral branch. The PDA was small in caliber  with no high-grade focal stenoses. The posterolateral branch was quite  small in caliber as well, without significant disease.    CONCLUSIONS  1. No significant obstructive coronary artery disease.  2. The previously stented distal right coronary artery was widely patent.  3. Low-normal left ventricular systolic function without wall motion  abnormalities.  4. Normal resting left ventricular end-diastolic pressure.    RECOMMENDATIONS: Medical therapy and treatment for noncardiac causes of  chest pain.        Reviewed on 01/22/2015 9:48 AM            Trevor Iha, MD    cc:    Trevor Iha, MD    BKH/wmx; D:  01/21/2015 08:28 P; T:  01/22/2015 04:10 A; DOC# [id number]; JOB#  [id number]

## 2015-02-09 ENCOUNTER — Inpatient Hospital Stay: Admit: 2015-02-09 | Payer: MEDICARE | Attending: Family Medicine | Primary: Family Medicine

## 2015-02-09 DIAGNOSIS — Z1231 Encounter for screening mammogram for malignant neoplasm of breast: Secondary | ICD-10-CM

## 2015-02-12 ENCOUNTER — Encounter

## 2015-02-23 ENCOUNTER — Encounter: Attending: Internal Medicine | Primary: Family Medicine

## 2015-02-26 ENCOUNTER — Encounter

## 2015-02-26 ENCOUNTER — Ambulatory Visit: Payer: MEDICARE | Primary: Family Medicine

## 2015-02-26 ENCOUNTER — Inpatient Hospital Stay: Payer: MEDICARE | Primary: Family Medicine

## 2015-02-26 ENCOUNTER — Inpatient Hospital Stay: Admit: 2015-02-26 | Payer: MEDICARE | Attending: Family Medicine | Primary: Family Medicine

## 2015-02-26 DIAGNOSIS — R928 Other abnormal and inconclusive findings on diagnostic imaging of breast: Secondary | ICD-10-CM

## 2015-02-26 DIAGNOSIS — N6002 Solitary cyst of left breast: Secondary | ICD-10-CM

## 2017-01-24 ENCOUNTER — Inpatient Hospital Stay
Admit: 2017-01-24 | Discharge: 2017-01-24 | Disposition: A | Discharge status: Home / Self Care | Payer: MEDICARE | Attending: Emergency Medicine

## 2017-01-24 ENCOUNTER — Emergency Department: Admit: 2017-01-24 | Payer: MEDICARE | Primary: Family Medicine

## 2017-01-24 DIAGNOSIS — M62838 Other muscle spasm: Secondary | ICD-10-CM

## 2017-01-24 MED ORDER — LIDOCAINE 5 % (700 MG/PATCH) ADHESIVE PATCH
5 % | CUTANEOUS | 0 refills | Status: AC
Start: 2017-01-24 — End: ?

## 2017-01-24 MED ORDER — METHOCARBAMOL 750 MG TAB
750 mg | ORAL_TABLET | Freq: Three times a day (TID) | ORAL | 0 refills | Status: AC
Start: 2017-01-24 — End: 2017-01-29

## 2017-01-24 MED ORDER — DIAZEPAM 5 MG TAB
5 mg | ORAL | Status: AC
Start: 2017-01-24 — End: 2017-01-24
  Administered 2017-01-24: 23:00:00 via ORAL

## 2017-01-24 MED ORDER — ACETAMINOPHEN 500 MG TAB
500 mg | Freq: Once | ORAL | Status: AC
Start: 2017-01-24 — End: 2017-01-24
  Administered 2017-01-24: 23:00:00 via ORAL

## 2017-01-24 MED FILL — MAPAP EXTRA STRENGTH 500 MG TABLET: 500 mg | ORAL | Qty: 2

## 2017-01-24 MED FILL — DIAZEPAM 5 MG TAB: 5 mg | ORAL | Qty: 1

## 2017-01-24 NOTE — ED Notes (Signed)
Discharge instructions written & verbal given to patient & verbalizes understanding, home with family, friend after Jerilee Field PA in to explain plan of care

## 2017-01-24 NOTE — ED Provider Notes (Signed)
EMERGENCY DEPARTMENT HISTORY AND PHYSICAL EXAM      Date: 01/24/2017  Patient Name: Claudia [name]    History of Presenting Illness     Chief Complaint   Patient presents with   ??? Neck Pain     Patient ambulatory to triage with slow steady gait and complain of neck pain        History Provided By: Patient    HPI: Claudia [name], 61 y.o. female presents ambulatory to the ED with c/o acute onset neck pain on 01/18/17.  Pt states she moved her neck and "felt something crack" and reportedly has had pain and stiffness since that time.  She denied any h/o neck pain.  Has not needed to see Orthopedics for any other issue (no back pain, hip pain, knee pain).  Pt denied injury/strain.  No recent change in her physical routine.  No insect bites/stings.  Has not taken anything for the pain at home.  She denied numbness/weakness of the arms.   Pt is o/w healthy without fever, chills, cough, congestion, ST, shortness of breath, chest pain, N/V/D.      Chief Complaint: neck pain  Duration: 6 Days  Timing:  Acute  Location: posterior cervical region  Quality: Aching  Severity: 10 out of 10  Modifying Factors: pain worsens with cervical ROM  Associated Symptoms: denies any other associated signs or symptoms      There are no other complaints, changes, or physical findings at this time.    PCP: [name], MD    Current Facility-Administered Medications   Medication Dose Route Frequency Provider Last Rate Last Dose   ??? diazePAM (VALIUM) tablet 5 mg  5 mg Oral NOW [place], [place]       ??? acetaminophen (TYLENOL) tablet 1,000 mg  1,000 mg Oral [address] [place], [place]         Current Outpatient Prescriptions   Medication Sig Dispense Refill   ??? lidocaine (LIDODERM) 5 % Apply patch to the affected area for 12 hours a day and remove for 12 hours a day. 5 Each 0   ??? methocarbamol (ROBAXIN) 750 mg tablet Take 1 Tab by mouth three (3) times daily for 5 days. 15 Tab 0   ??? traZODone (DESYREL) 100 mg tablet Take 100 mg by mouth nightly.      ??? loratadine (CLARITIN) 10 mg tablet Take 10 mg by mouth daily.     ??? nicotine polacrilex 4 mg lozenge 4 mg by Buccal route as needed for Smoking Cessation.     ??? promethazine-dextromethorphan (PROMETHAZINE-DM) 6.25-15 mg/5 mL syrup Take 5 mL by mouth every four (4) hours as needed for Cough.     ??? ezetimibe (ZETIA) 10 mg tablet Take 5 mg by mouth daily.     ??? albuterol (PROVENTIL VENTOLIN) 2.5 mg /3 mL (0.083 %) nebulizer solution 2.5 mg by Nebulization route every six (6) hours.     ??? docusate sodium (COLACE) 100 mg capsule Take 1 Cap by mouth two (2) times a day. 30 Cap 0   ??? tiotropium (SPIRIVA WITH HANDIHALER) 18 mcg inhalation capsule Take 1 Cap by inhalation daily.     ??? atorvastatin (LIPITOR) 40 mg tablet Take  by mouth every morning. Indications: HYPERCHOLESTEROLEMIA     ??? cholecalciferol (VITAMIN D3) 1,000 unit tablet Take  by mouth daily.     ??? aspirin 81 mg tablet Take 81 mg by mouth daily.     ??? citalopram (CELEXA)  40 mg tablet Take 40 mg by mouth every morning.     ??? benztropine (COGENTIN) 0.5 mg tablet Take 0.5 mg by mouth nightly.     ??? clopidogrel (PLAVIX) 75 mg tablet Take 75 mg by mouth daily.     ??? olanzapine (ZYPREXA) 15 mg tablet Take 15 mg by mouth nightly. Indications: SCHIZOPHRENIA         Past History     Past Medical History:  Past Medical History:   Diagnosis Date   ??? Arthritis     unknown type - back   ??? CAD (coronary artery disease)      stent placed in 2006 [place]; followed by Dr Henriette Combs   ??? Cancer [facility])     colon ca   ??? Chronic obstructive pulmonary disease ([place])     managed by PCP   ??? Hypercholesteremia    ??? MI (myocardial infarction) ([place]) 2006    in [place]   ??? Psychiatric disorder     schizophrenia   ??? Schizophrenia ([place])     Dr Beverlee Nims on [facility]       Past Surgical History:  Past Surgical History:   Procedure Laterality Date   ??? CARDIAC SURG PROCEDURE UNLIST  2006    stent placed   ??? COLORECTAL SCRN; HI RISK IND  11/01/2013        ??? HX HEENT      teeth removal        Family History:  Family History   Problem Relation Age of Onset   ??? Heart Attack Father    ??? Cancer Mother      breast   ??? Breast Cancer Mother      30's   ??? Cancer Brother      kidney       Social History:  Social History   Substance Use Topics   ??? Smoking status: Current Every Day Smoker     Packs/day: 1.50     Years: 42.00   ??? Smokeless tobacco: Never Used   ??? Alcohol use No       Allergies:  No Known Allergies      Review of Systems   Review of Systems   Constitutional: Negative for chills and fever.   HENT: Negative for congestion, rhinorrhea and sore throat.    Eyes: Negative for pain and redness.   Respiratory: Negative for cough and shortness of breath.    Cardiovascular: Negative for chest pain and palpitations.   Gastrointestinal: Negative for abdominal pain, diarrhea, nausea and vomiting.   Genitourinary: Negative for dysuria and hematuria.   Musculoskeletal: Positive for neck pain and neck stiffness. Negative for back pain.   Skin: Negative for rash and wound.   Allergic/Immunologic: Negative for food allergies and immunocompromised state.   Neurological: Negative for dizziness, weakness, numbness and headaches.   Psychiatric/Behavioral: Negative for agitation and confusion.       Physical Exam   Physical Exam   Constitutional: She is oriented to person, place, and time. She appears well-developed and well-nourished. No distress.   WDWN female, alert, in NAD   HENT:   Head: Normocephalic and atraumatic.   Nose: Nose normal.   Eyes: Conjunctivae and EOM are normal. Right eye exhibits no discharge. Left eye exhibits no discharge. No scleral icterus.   Neck: Neck supple. No JVD present. No tracheal deviation present. No thyromegaly present.   Decreased cervical ROM noted due to tenderness, TTP C4/C5 region with palpable spasm noted.  2+ radial pulses, NVI, sensation grossly intact to light touch.     Cardiovascular: Normal rate, regular rhythm and normal heart sounds.     Pulmonary/Chest: Effort normal and breath sounds normal. No respiratory distress. She has no wheezes.   Abdominal: There is no tenderness.   Musculoskeletal: She exhibits tenderness. She exhibits no edema.   See NECK exam   Lymphadenopathy:     She has no cervical adenopathy.   Neurological: She is alert and oriented to person, place, and time. She exhibits normal muscle tone. Coordination normal.   Skin: Skin is warm and dry. No rash noted. She is not diaphoretic. No erythema.   Psychiatric: She has a normal mood and affect. Her behavior is normal. Judgment normal.   Nursing note and vitals reviewed.      Diagnostic Study Results     Labs -   No results found for this or any previous visit (from the past 12 hour(s)).    Radiologic Studies -   XR SPINE CERV 4 OR 5 V    (Results Pending)              Lab Results    ?? None   ??   Imaging Results    ??    ?? XR SPINE CERV 4 OR 5 V (Final result) Result time: 01/24/17 19:37:59   ?? Final result by Edi, Rad Results In (01/24/17 19:31:46)   ?? Initial Result:   ?? Impression:   ?? IMPRESSION: ??. .No acute bony abnormality of the cervical spine. Poor  visualization of C7 and C7-T1. If there has been a history of trauma, a  swimmer's view is recommended.     ?? Narrative:   ?? INDICATION: neck pain. No reported trauma.    EXAM: CERVICAL SPINE RADIOGRAPHS, MINIMUM 4 VIEWS.    FINDINGS: A five-view examination of the cervical spine reveals loss of the  usual cervical lordosis and normal bone mineral content for age. The vertebral  bodies of C1-C6 7 are visualized. See 7 and C7-T1 cannot be seen. There is no  obvious acute fracture or dislocation .Vertebral body heights are preserved.  Disc space heights are diminished at C4-5, C5-6 and C6-7 with endplate sclerosis  and spondylosis.. The prevertebral soft tissue space is normal, as is the  predental space. Odontoid view shows normal alignment. . There is mild diffuse  degenerative change.     ??    ??   ECG Results    ?? None   ??          Medical Decision Making   I am the first provider for this patient.    I reviewed the vital signs, available nursing notes, past medical history, past surgical history, family history and social history.    Vital Signs-Reviewed the patient's vital signs.  Patient Vitals for the past 12 hrs:   Temp Pulse Resp BP SpO2   01/24/17 1747 98.7 ??F (37.1 ??C) 90 16 142/71 93 %         Records Reviewed: Nursing Notes, Old Medical Records, Previous Radiology Studies and Previous Laboratory Studies    Provider Notes (Medical Decision Making):   Strain, spasm, DDD, fx    ED Course:   Initial assessment performed. The patients presenting problems have been discussed, and they are in agreement with the care plan formulated and outlined with them.  I have encouraged them to ask questions as they arise throughout their visit.    DISCHARGE NOTE:  1945  The care plan has been outline with the patient and/or family, who verbally conveyed understanding and agreement. Available results have been reviewed. Patient and/or family understand the follow up plan as outlined and discharge instructions. Should their condition deterioration at any time after discharge the patient agrees to return, follow up sooner than outlined or seek medical assistance at the closest Emergency Room as soon as possible. Questions have been answered. Discharge instructions and educational information regarding the patient's diagnosis as well a list of reasons why the patient would want to seek immediate medical attention, should their condition change, were reviewed directly with the patient/family       PLAN:  1.   Current Discharge Medication List      START taking these medications    Details   lidocaine (LIDODERM) 5 % Apply patch to the affected area for 12 hours a day and remove for 12 hours a day.  Qty: 5 Each, Refills: 0      methocarbamol (ROBAXIN) 750 mg tablet Take 1 Tab by mouth three (3) times daily for 5 days.  Qty: 15 Tab, Refills: 0           2.    Follow-up Information     Follow up With Details Comments [place], MD  As needed [address]  [address]  [place] [address]  [phone]      [facility] EMERGENCY DEPT  If symptoms worsen [place]  [phone]        Return to ED if worse     Diagnosis     Clinical Impression:   1. Neck pain    2. Cervical paraspinal muscle spasm

## 2017-01-24 NOTE — ED Triage Notes (Signed)
Pt states woke up Sunday  And moved neck and heard something crack and has had a stiff neck ever since.   Denies any neuro deficits that are new.

## 2017-06-15 NOTE — Telephone Encounter (Signed)
Pt was called for possible Pulmonary Rehab Intake, left a message.

## 2017-06-29 NOTE — Telephone Encounter (Signed)
Pt was called for possible PR Intake, pt declined.

## 2017-08-04 ENCOUNTER — Inpatient Hospital Stay: Payer: MEDICARE

## 2017-08-04 MED ORDER — PROPOFOL 10 MG/ML IV EMUL
10 mg/mL | INTRAVENOUS | Status: DC | PRN
Start: 2017-08-04 — End: 2017-08-04
  Administered 2017-08-04 (×10): via INTRAVENOUS

## 2017-08-04 MED ORDER — EPINEPHRINE 0.1 MG/ML SYRINGE
0.1 mg/mL | Freq: Once | INTRAMUSCULAR | Status: DC | PRN
Start: 2017-08-04 — End: 2017-08-04

## 2017-08-04 MED ORDER — NALOXONE 0.4 MG/ML INJECTION
0.4 mg/mL | INTRAMUSCULAR | Status: DC | PRN
Start: 2017-08-04 — End: 2017-08-04

## 2017-08-04 MED ORDER — SODIUM CHLORIDE 0.9 % IJ SYRG
INTRAMUSCULAR | Status: DC | PRN
Start: 2017-08-04 — End: 2017-08-04

## 2017-08-04 MED ORDER — SIMETHICONE 40 MG/0.6 ML ORAL DROPS, SUSP
40 mg/0.6 mL | ORAL | Status: DC | PRN
Start: 2017-08-04 — End: 2017-08-04
  Administered 2017-08-04: 17:00:00 via ORAL

## 2017-08-04 MED ORDER — SODIUM CHLORIDE 0.9 % IV
INTRAVENOUS | Status: DC
Start: 2017-08-04 — End: 2017-08-04
  Administered 2017-08-04: 16:00:00 via INTRAVENOUS

## 2017-08-04 MED ORDER — SODIUM CHLORIDE 0.9 % IJ SYRG
Freq: Three times a day (TID) | INTRAMUSCULAR | Status: DC
Start: 2017-08-04 — End: 2017-08-04

## 2017-08-04 MED ORDER — MIDAZOLAM 1 MG/ML IJ SOLN
1 mg/mL | INTRAMUSCULAR | Status: DC
Start: 2017-08-04 — End: 2017-08-04

## 2017-08-04 MED ORDER — FLUMAZENIL 0.1 MG/ML IV SOLN
0.1 mg/mL | INTRAVENOUS | Status: DC | PRN
Start: 2017-08-04 — End: 2017-08-04

## 2017-08-04 MED ORDER — EPHEDRINE SULFATE 50 MG/ML INJECTION SOLUTION
50 mg/mL | INTRAMUSCULAR | Status: DC | PRN
Start: 2017-08-04 — End: 2017-08-04
  Administered 2017-08-04: 16:00:00 via INTRAVENOUS

## 2017-08-04 MED ORDER — SIMETHICONE 40 MG/0.6 ML ORAL DROPS, SUSP
40 mg/0.6 mL | ORAL | Status: DC | PRN
Start: 2017-08-04 — End: 2017-08-04

## 2017-08-04 MED ORDER — ATROPINE 0.1 MG/ML SYRINGE
0.1 mg/mL | Freq: Once | INTRAMUSCULAR | Status: DC | PRN
Start: 2017-08-04 — End: 2017-08-04

## 2017-08-04 MED FILL — SODIUM CHLORIDE 0.9 % IV: INTRAVENOUS | Qty: 1000

## 2017-08-04 MED FILL — SIMETHICONE 40 MG/0.6 ML ORAL DROPS, SUSP: 40 mg/0.6 mL | ORAL | Qty: 60

## 2017-08-04 MED FILL — DIPRIVAN 10 MG/ML INTRAVENOUS EMULSION: 10 mg/mL | INTRAVENOUS | Qty: 190

## 2017-08-04 NOTE — Other (Signed)
Anesthesia reports 190mg  Propofol, 0mg  Lidocaine and 372mL NS given during procedure.  Received report from anesthesia staff on vital signs and status of patient.

## 2017-08-04 NOTE — H&P (Signed)
Jeanine Luz, MD  [facility], [place], [place] [address]  [phone]  [email or web address]      See office H and P.  No interval change.    Date of Surgery Update:  Claudia [name] was seen and examined.  History and physical has been reviewed. The patient has been examined. There have been no significant clinical changes since the completion of the originally dated History and Physical.    Signed By: Eugenie Filler., MD     August 04, 2017 12:22 PM

## 2017-08-04 NOTE — Other (Signed)
Pt up to bathroom with assistance, steady gait,  getting dressed, and tolerates activity well.

## 2017-08-04 NOTE — Other (Signed)
Endoscope was pre-cleaned at the bedside immediately following procedure by L. Holsclawe ET.

## 2017-08-04 NOTE — Other (Signed)
JEANNI ALLSHOUSE  1955-08-21  [id number]    Situation:  Verbal report received from: Lenise Arena rn  Procedure: Procedure(s):  COLONOSCOPY  ENDOSCOPIC POLYPECTOMY  COLON BIOPSY    Background:    Preoperative diagnosis: HX COLON CA, LONG TERM ANTICOAGULANT USE  Postoperative diagnosis: Polyps and Internal Hemorrhoids    Operator:  Dr. Gardenia Phlegm  Assistant(s): Endoscopy Technician-1: [place], Bethanne Ginger  Endoscopy RN-1: Lawernce Pitts C    Specimens:   ID Type Source Tests Collected by Time Destination   1 : Ascending Colon Polyp Preservative Colon, Ascending  Eugenie Filler., MD 08/04/2017 1236 Pathology   2 : Descending  Colon Polyps Preservative Colon, Descending  Eugenie Filler., MD 08/04/2017 1240 Pathology     H. Pylori  no    Assessment:  Intra-procedure medications   Anesthesia gave intra-procedure sedation and medications, see anesthesia flow sheet yes    Intravenous fluids: NS@ KVO     Vital signs stable     Abdominal assessment: round and soft     Recommendation:  Discharge patient per MD order.    Family or Friend   Permission to share finding with family or friend yes

## 2017-08-04 NOTE — Anesthesia Post-Procedure Evaluation (Signed)
Procedure(s):  COLONOSCOPY  ENDOSCOPIC POLYPECTOMY  COLON BIOPSY.    MAC, total IV anesthesia    Anesthesia Post Evaluation        Patient location during evaluation: PACU  Note status: Adequate.  Level of consciousness: responsive to verbal stimuli and sleepy but conscious  Pain management: satisfactory to patient  Airway patency: patent  Anesthetic complications: no  Cardiovascular status: acceptable  Respiratory status: acceptable  Hydration status: acceptable  Comments: +Post-Anesthesia Evaluation and Assessment    Patient: Claudia [name] MRN: [id number]  SSN: [id number]   Date of Birth: June 29, 1955  Age: 62 y.o.  Sex: female      Cardiovascular Function/Vital Signs    BP 103/68    Pulse 73    Temp 36.8 ??C (98.2 ??F)    Resp 26    Ht 5' 4"  (1.626 m)    Wt 58.5 kg (129 lb)    SpO2 98%    BMI 22.14 kg/m??     Patient is status post Procedure(s):  COLONOSCOPY  ENDOSCOPIC POLYPECTOMY  COLON BIOPSY.    Nausea/Vomiting: Controlled.    Postoperative hydration reviewed and adequate.    Pain:  Pain Scale 1: Numeric (0 - 10) (08/04/17 1135)  Pain Intensity 1: 0 (08/04/17 1135)   Managed.    Neurological Status:       At baseline.    Mental Status and Level of Consciousness: Arousable.    Pulmonary Status:   O2 Device: CO2 nasal cannula (08/04/17 1245)   Adequate oxygenation and airway patent.    Complications related to anesthesia: None    Post-anesthesia assessment completed. No concerns.    Signed By: Rubye Beach, MD    08/04/2017  Post anesthesia nausea and vomiting:  controlled      Vitals Value Taken Time   BP 103/68 08/04/2017 12:45 PM   Temp     Pulse 73 08/04/2017 12:45 PM   Resp 26 08/04/2017 12:45 PM   SpO2 98 % 08/04/2017 12:45 PM

## 2017-08-04 NOTE — Procedures (Signed)
[place]                  Colonoscopy Operative Report    08/04/2017      Claudia [name]  [id number]  [birth year]    Procedure Type:   Colonoscopy --screening     Indications:    Personal history of colon cancer (screening only)     Pre-operative Diagnosis: see indication above    Post-operative Diagnosis:  See findings below    Operator:  Elmon Else, MD    Referring Provider: [name], MD      Sedation:  MAC anesthesia Propofol    Pre-Procedural Exam:      Airway: clear,  No airway problems anticipated  Heart: RRR, without gallops or rubs  Lungs: clear bilaterally without wheezes, crackles, or rhonchi  Abdomen: soft, nontender, nondistended, bowel sounds present  Mental Status: awake, alert and oriented to person, place and time     Procedure Details:  After informed consent was obtained with all risks and benefits of procedure explained and preoperative exam completed, the patient was taken to the endoscopy suite and placed in the left lateral decubitus position.  Upon sequential sedation as per above, a digital rectal exam was performed .  The Olympus videocolonoscope  was inserted in the rectum and carefully advanced to the cecum, which was identified by the ileocecal valve and appendiceal orifice.  The cecum was identified by the ileocecal valve and appendiceal orifice.  The quality of preparation was adequate.  The colonoscope was slowly withdrawn with careful evaluation between folds. Retroflexion in the rectum was completed demonstrating internal hemorrhoids.     Findings:   Rectum: Grade 1 internal hemorrhoid(s);  Sigmoid: surgically absent  Descending Colon: 4 and 5 mm polyps, cold biopsied off and 8 mm polyp, cold snared  Transverse Colon: normal  Ascending Colon: 6 mm polyp, cold snared  Cecum: normal  Terminal Ileum: not intubated      Specimen Removed:  1. ascending colonic polyp  2 descending colonic polyps    Complications: None.     EBL:  None.     Impression:    1. colonic polyps removed as above  2. s/p sigmoid colectomy    Recommendations: --Await pathology., -Repeat colonoscopy in 3 years. High fiber diet.  Resume normal medication(s).   Discharge Disposition:  Home in the company of a driver when able to ambulate.    Eugenie Filler., MD    08/04/2017   P. Jethro Bolus, MD  [facility], [address], [place] [address]  [phone]  [email or web address]

## 2017-08-04 NOTE — Anesthesia Pre-Procedure Evaluation (Addendum)
Anesthetic History   No history of anesthetic complications            Review of Systems / Medical History  Patient summary reviewed, nursing notes reviewed and pertinent labs reviewed    Pulmonary    COPD: mild            Comments: Smoker - 1.5 ppd x 42 years   Neuro/Psych         Psychiatric history    Comments: Schizophrenia Cardiovascular              Past MI, CAD and cardiac stents    Exercise tolerance: >4 METS  Comments: MI 2006   GI/Hepatic/Renal     GERD: well controlled      PUD    Comments: H/O Colon Cancer s/p Sigmoid Colectomy (12/23/12) Endo/Other        Obesity, arthritis and cancer     Other Findings   Comments: Colon Cancer         Physical Exam    Airway  Mallampati: I  TM Distance: > 6 cm  Neck ROM: normal range of motion   Mouth opening: Normal     Cardiovascular  Regular rate and rhythm,  S1 and S2 normal,  no murmur, click, rub, or gallop             Dental    Dentition: Edentulous     Pulmonary  Breath sounds clear to auscultation              Comments: Clear but distant breath sounds Abdominal  GI exam deferred       Other Findings            Anesthetic Plan    ASA: 3  Anesthesia type: MAC and total IV anesthesia          Induction: Intravenous  Anesthetic plan and risks discussed with: Patient

## 2017-08-04 NOTE — Other (Signed)
Patient wheeled to waiting car for discharge.  Patient states having all belongings

## 2017-08-04 NOTE — Procedures (Signed)
Procedures by Eugenie Filler., MD at 08/04/17 1244                Author: Eugenie Filler., MD  Service: Gastroenterology  Author Type: Physician       Filed: 08/04/17 1248  Date of Service: 08/04/17 1244  Status: Signed          Editor: Eugenie Filler., MD (Physician)            Pre-procedure Diagnoses        1. History of colon cancer [Z85.038]                           Post-procedure Diagnoses        1. Benign neoplasm of ascending colon [D12.2]        2. Benign neoplasm of descending colon [D12.4]        3. Internal hemorrhoids [K64.8]                           Procedures        1. COLORECTAL SCRN; HI RISK IND [G0105 ([facility])]        2. Herbert Seta [[id number]]                                 [facility]                        Colonoscopy Operative Report      08/04/2017         Claudia [name]   [id number]   06-23-1955      Procedure Type:   Colonoscopy --screening       Indications:    Personal history of colon cancer (screening only)       Pre-operative Diagnosis: see indication above      Post-operative Diagnosis:  See findings below      Operator:  Elmon Else, MD      Referring Provider: [name], MD         Sedation:  MAC anesthesia Propofol      Pre-Procedural Exam:        Airway: clear,  No airway problems anticipated   Heart: RRR, without gallops or rubs   Lungs: clear bilaterally without wheezes, crackles, or rhonchi   Abdomen: soft, nontender, nondistended, bowel sounds present   Mental Status: awake, alert and oriented to person, place and time       Procedure Details:  After informed consent was obtained with all risks and benefits of procedure explained and preoperative exam completed,  the patient was taken to the endoscopy suite and placed in the left lateral decubitus position.  Upon sequential sedation as per above, a digital rectal exam was performed .  The Olympus videocolonoscope  was  inserted in the rectum and carefully advanced  to the cecum, which was identified by the ileocecal valve and appendiceal orifice.  The cecum was identified by the ileocecal valve and appendiceal  orifice.  The quality of preparation was adequate.  The colonoscope was slowly withdrawn with careful evaluation between folds. Retroflexion in the rectum was completed demonstrating internal  hemorrhoids.       Findings:    Rectum: Grade 1 internal hemorrhoid(s);   Sigmoid: surgically absent  Descending Colon: 4 and 5 mm polyps, cold biopsied off and 8 mm polyp, cold snared   Transverse Colon: normal   Ascending Colon: 6 mm polyp, cold snared   Cecum: normal   Terminal Ileum: not intubated         Specimen Removed:  1. ascending colonic polyp  2 descending colonic polyps      Complications: None.       EBL:  None.      Impression:    1. colonic polyps removed as above  2. s/p sigmoid colectomy      Recommendations: --Await pathology., -Repeat colonoscopy in 3 years. High fiber diet.  Resume normal medication(s).    Discharge Disposition:  Home in the company of a driver when able to ambulate.      Eugenie Filler., MD      08/04/2017       P. Jethro Bolus, MD   [facility], [place], [place] [address]   [phone]   [email or web address]

## 2017-09-25 NOTE — Telephone Encounter (Signed)
09-25-17  Spoke to Medicaid, [place] - verified that [phone] is billiable and no pre-auth is needed. Since Medicare is primary

## 2017-09-28 ENCOUNTER — Inpatient Hospital Stay: Admit: 2017-09-28 | Payer: MEDICARE | Primary: Family Medicine

## 2017-09-28 DIAGNOSIS — J449 Chronic obstructive pulmonary disease, unspecified: Secondary | ICD-10-CM

## 2017-09-28 NOTE — Other (Addendum)
Cardiopulmonary Rehab Intake Note:  Met with Mrs. Nettie Elm for initial pulmonary visit. Mr.  Claudia [name] is a 62 year old patient of Dr. Raynelle Dick who presents with COPD.  History of HTN, colon cancer, MI schizophrenia,  and sedentary lifestyle. She is currently smoking but has recently switches to vaping.  Counseled patient on dangers related to both.   ??  Limitations to exercise are primarily related to deconditioning??  ????  She currently is not exercising at home. ??  ????  She competed the 6 minute walk test on the track, walking 165 meters, mets 1.8, RPE 11 and RPD 2. ??She did have some slight shortness of breath while exercising.  She also was placed on [facility].  Her starting oxygen was 90%.  The 2 liters brought her up to 98%.  ????  Psychosocial: Pt scored 3 on PHQ9.  She is retired and crochets to relax.   ????  Patient was given an overview of cardiac rehab program, placement of electrode/leads, and rationale for program. Patient viewed the cardiac rehab DVD and an education notebook was given.   ????  Heart rhythm on the monitor was a NSR     BMI 24  Patient's identified goals are  1. To breathe easier  2. To gain strength in her legs.

## 2017-09-28 NOTE — Other (Signed)
COPD Assessment Tool (CAT)     0-5           I never cough/I cough all the time       Score:5      I have no phlegm in my chest/ My chest is completely full of phlegm  Score:5      My chest does not feel tight at all/ My chest feels very tight   Score:0      When I walk up a hill or stairs I am bot breathless/ 5  When I walk up a hill or stairs I am very breathless    Score:5      I am not limited doing any activities at home/   I am very limited doing activities at home     Score:5      I am confident leaving my home despite my lung condition/  I am not at all confident leaving my home because of my lung condition Score:5      I sleep soundly/ I don't sleep because of my lung condition   Score:0      I have lots of energy/ I have no energy at all     Score:0                     Total:25

## 2017-10-06 ENCOUNTER — Inpatient Hospital Stay: Admit: 2017-10-06 | Payer: MEDICARE | Primary: Family Medicine

## 2017-10-08 ENCOUNTER — Inpatient Hospital Stay: Admit: 2017-10-08 | Payer: MEDICARE | Primary: Family Medicine

## 2017-10-13 ENCOUNTER — Encounter: Payer: MEDICARE | Primary: Family Medicine

## 2017-10-13 ENCOUNTER — Inpatient Hospital Stay: Admit: 2017-10-13 | Payer: MEDICARE | Primary: Family Medicine

## 2017-10-13 DIAGNOSIS — J449 Chronic obstructive pulmonary disease, unspecified: Secondary | ICD-10-CM

## 2017-10-15 ENCOUNTER — Encounter: Payer: MEDICARE | Primary: Family Medicine

## 2017-10-15 ENCOUNTER — Inpatient Hospital Stay: Admit: 2017-10-15 | Payer: MEDICARE | Primary: Family Medicine

## 2017-10-20 ENCOUNTER — Encounter: Payer: MEDICARE | Primary: Family Medicine

## 2017-10-20 ENCOUNTER — Inpatient Hospital Stay: Admit: 2017-10-20 | Payer: MEDICARE | Primary: Family Medicine

## 2017-10-22 ENCOUNTER — Encounter: Payer: MEDICARE | Primary: Family Medicine

## 2017-10-22 ENCOUNTER — Inpatient Hospital Stay: Admit: 2017-10-22 | Payer: MEDICARE | Primary: Family Medicine

## 2017-10-22 NOTE — Other (Addendum)
Patient reported 6/10 chest pain which happened yesterday.  Occurred while she was just resting.  Pain went away quickly.  Not having any pain today. Called Dr. Isabelle Course office and spoke with Jarrett Soho who will call patient to make an appointment. Felt this was the best course of action as patient has learning disabilities and I am unsure if she will follow up on her own.

## 2017-10-27 ENCOUNTER — Encounter: Payer: MEDICARE | Primary: Family Medicine

## 2017-10-27 ENCOUNTER — Inpatient Hospital Stay: Admit: 2017-10-27 | Payer: MEDICARE | Primary: Family Medicine

## 2017-10-29 ENCOUNTER — Inpatient Hospital Stay: Admit: 2017-10-29 | Payer: MEDICARE | Primary: Family Medicine

## 2017-10-29 ENCOUNTER — Encounter: Payer: MEDICARE | Primary: Family Medicine

## 2017-11-03 ENCOUNTER — Inpatient Hospital Stay: Admit: 2017-11-03 | Payer: MEDICARE | Primary: Family Medicine

## 2017-11-05 ENCOUNTER — Inpatient Hospital Stay: Admit: 2017-11-05 | Payer: MEDICARE | Primary: Family Medicine

## 2017-11-10 ENCOUNTER — Inpatient Hospital Stay: Admit: 2017-11-10 | Payer: MEDICARE | Primary: Family Medicine

## 2017-11-10 DIAGNOSIS — J449 Chronic obstructive pulmonary disease, unspecified: Secondary | ICD-10-CM

## 2017-11-17 ENCOUNTER — Inpatient Hospital Stay: Admit: 2017-11-17 | Payer: MEDICARE | Primary: Family Medicine

## 2017-11-19 ENCOUNTER — Inpatient Hospital Stay: Admit: 2017-11-19 | Payer: MEDICARE | Primary: Family Medicine

## 2017-11-19 NOTE — Telephone Encounter (Signed)
11-19-17, per patient will be on 2 weeks vacation from 7-16, per patient will contact us to advise to restart program and [employer] svc

## 2017-11-24 ENCOUNTER — Encounter: Payer: MEDICARE | Primary: Family Medicine

## 2017-11-26 ENCOUNTER — Encounter: Payer: MEDICARE | Primary: Family Medicine

## 2017-12-01 ENCOUNTER — Encounter: Payer: MEDICARE | Primary: Family Medicine

## 2017-12-02 NOTE — Telephone Encounter (Signed)
Cardiopulmonary Rehab Nursing Entry:    Pt called to report that she is back in town and needs to re-schedule her PR appts and transportation.  Pt scheduled for tomorrow, Thursday, 7/25 @ 2:00pm and Lyft/Roundtrip arranged by pt.

## 2017-12-03 ENCOUNTER — Encounter: Payer: MEDICARE | Primary: Family Medicine

## 2017-12-03 ENCOUNTER — Inpatient Hospital Stay: Admit: 2017-12-03 | Payer: MEDICARE | Primary: Family Medicine

## 2017-12-03 NOTE — Telephone Encounter (Signed)
Pt was called today due to absence, pt stated that she will be in today.

## 2017-12-08 ENCOUNTER — Inpatient Hospital Stay: Admit: 2017-12-08 | Payer: MEDICARE | Primary: Family Medicine

## 2017-12-08 ENCOUNTER — Encounter: Payer: MEDICARE | Primary: Family Medicine

## 2017-12-08 ENCOUNTER — Inpatient Hospital Stay: Admit: 2017-12-15 | Payer: MEDICARE | Attending: Registered" | Primary: Family Medicine

## 2017-12-08 DIAGNOSIS — E119 Type 2 diabetes mellitus without complications: Secondary | ICD-10-CM

## 2017-12-10 ENCOUNTER — Encounter: Payer: MEDICARE | Primary: Family Medicine

## 2017-12-10 ENCOUNTER — Inpatient Hospital Stay: Admit: 2017-12-10 | Payer: MEDICARE | Primary: Family Medicine

## 2017-12-10 DIAGNOSIS — J449 Chronic obstructive pulmonary disease, unspecified: Secondary | ICD-10-CM

## 2017-12-15 ENCOUNTER — Inpatient Hospital Stay: Admit: 2017-12-15 | Payer: MEDICARE | Primary: Family Medicine

## 2017-12-17 ENCOUNTER — Encounter

## 2017-12-17 ENCOUNTER — Inpatient Hospital Stay: Admit: 2017-12-17 | Payer: MEDICARE | Primary: Family Medicine

## 2017-12-22 ENCOUNTER — Inpatient Hospital Stay: Admit: 2017-12-22 | Payer: MEDICARE | Primary: Family Medicine

## 2017-12-23 ENCOUNTER — Encounter: Primary: Family Medicine

## 2017-12-24 ENCOUNTER — Inpatient Hospital Stay: Admit: 2017-12-24 | Payer: MEDICARE | Primary: Family Medicine

## 2017-12-25 ENCOUNTER — Encounter

## 2017-12-28 ENCOUNTER — Encounter: Primary: Family Medicine

## 2017-12-29 ENCOUNTER — Inpatient Hospital Stay: Admit: 2017-12-29 | Payer: MEDICARE | Primary: Family Medicine

## 2017-12-31 ENCOUNTER — Inpatient Hospital Stay: Admit: 2017-12-31 | Payer: MEDICARE | Attending: Registered" | Primary: Family Medicine

## 2017-12-31 DIAGNOSIS — E119 Type 2 diabetes mellitus without complications: Secondary | ICD-10-CM

## 2017-12-31 NOTE — Other (Signed)
[facility]        12/31/2017    Dear Tyler Aas MD,    Thank you for your kind referral. Your patient, Sukari Grist, attended Session #1 at [facility] where the following topics were covered today:  *Describing diabetes disease process and treatment options   *Incorporating nutrition management into their lifestyle   *Monitoring blood glucose and other parameters and interpreting and using the results for self   management decision making   *Preventing, detecting and treating acute complications   *Incorporating physical activity into lifestyle  *Using medications safely and for maximum therapeutic effectiveness   * Developing personal strategies to promote health and behavior change   *Developing personal strategies to address psychosocial issues and concerns    Data from first visit:  Weight: 12/08/2017 150.6#  HgbA1c: 12/08/2017 5.1%  Increased risk for diabetes: 5.7-6.4%, Diabetes >6.4%  Glycemic control for adults with diabetes: < 7% Elderly or multiple medical conditions <8%    Random blood glucose: 139 mg/dL  Meter given: AccuChek Guide   Goal(s) set : remove sugar free candies and sweets from home    Your patient will have two (2) additional appointments to complete the ordered education. Their next visit is scheduled for 01/25/18    We look forward to assisting your patient in meeting their self-management goals. If you have any questions, please do not hesitate to call the [facility] at [phone]    Sincerely, Joaquin Bend, RD, [place] [address]  Phone: [phone] Fax: [phone]

## 2017-12-31 NOTE — Other (Signed)
Pt c/o feeling dizzy while exercising on the [place], exercise was stopped, B/P 140/70, SAO2 99%, 1428 blood glucose was 67, pt was allowed to rest and was given apple juice and peanut butter crackers, blood glucose 77 at 1445. Pt was given more apple juice and a trail mix bar. Blood glucose was rechecked at 1510, 142. Pt was stable and the dizziness had passed.

## 2017-12-31 NOTE — Group Note (Signed)
Diabetes Mgmt by Rob Bunting, RD at 12/31/17 1615                Author: Rob Bunting, RD  Service: --  Author Type: Registered Dietitian       Filed: 12/31/17 1636  Date of Service: 12/31/17 1615  Status: Signed          Editor: Rob Bunting, RD (Registered Dietitian)                      [facility]            12/31/2017      Dear Tyler Aas MD,      Thank you for your kind referral. Your patient, Claudia [name], attended Session #1 at [facility] where the following topics were covered today:   *Describing diabetes disease process and treatment options    *Incorporating nutrition management into their lifestyle    *Monitoring blood glucose and other parameters and interpreting and using the results for self    management decision making    *Preventing, detecting and treating acute complications    *Incorporating physical activity into lifestyle   *Using medications safely and for maximum therapeutic effectiveness    * Developing personal strategies to promote health and behavior change    *Developing personal strategies to address psychosocial issues and concerns      Data from first visit:   Weight: 12/08/2017 150.6#   HgbA1c: 12/08/2017 5.1%   Increased risk for diabetes: 5.7-6.4%, Diabetes >6.4%   Glycemic control for adults with diabetes: < 7% Elderly or multiple medical conditions <8%      Random blood glucose: 139 mg/dL   Meter given: AccuChek Guide    Goal(s) set : remove sugar free candies and sweets from home      Your patient will have two (2) additional appointments to complete the ordered education. Their next visit is scheduled for 01/25/18      We look forward to assisting your patient in meeting their self-management goals. If you have any questions, please do not hesitate to call the [facility] at [phone]      Sincerely, Joaquin Bend, RD, [place] [address]   Phone: [phone] Fax: [phone]

## 2018-01-01 ENCOUNTER — Inpatient Hospital Stay: Payer: MEDICARE | Primary: Family Medicine

## 2018-01-01 LAB — GLUCOSE, POC
Glucose (POC): 142 mg/dL — ABNORMAL HIGH (ref 65–100)
Glucose (POC): 67 mg/dL (ref 65–100)
Glucose (POC): 77 mg/dL (ref 65–100)

## 2018-01-01 LAB — POCT GLUCOSE
POC Glucose: 142 mg/dL — ABNORMAL HIGH (ref 65–100)
POC Glucose: 67 mg/dL (ref 65–100)
POC Glucose: 77 mg/dL (ref 65–100)

## 2018-01-05 ENCOUNTER — Inpatient Hospital Stay: Admit: 2018-01-05 | Payer: MEDICARE | Primary: Family Medicine

## 2018-01-07 ENCOUNTER — Inpatient Hospital Stay: Admit: 2018-01-07 | Payer: MEDICARE | Primary: Family Medicine

## 2018-01-12 ENCOUNTER — Inpatient Hospital Stay: Admit: 2018-01-12 | Payer: MEDICARE | Primary: Family Medicine

## 2018-01-12 DIAGNOSIS — J449 Chronic obstructive pulmonary disease, unspecified: Secondary | ICD-10-CM

## 2018-01-14 ENCOUNTER — Encounter: Payer: MEDICARE | Primary: Family Medicine

## 2018-01-18 NOTE — Telephone Encounter (Signed)
Pt  Called updated contacted number for lyft svc as [phone]. Says her mother in law # just ask for her.

## 2018-01-19 ENCOUNTER — Inpatient Hospital Stay: Admit: 2018-01-19 | Payer: MEDICARE | Primary: Family Medicine

## 2018-01-21 ENCOUNTER — Inpatient Hospital Stay: Admit: 2018-01-22 | Payer: MEDICARE | Primary: Family Medicine

## 2018-01-25 ENCOUNTER — Encounter: Primary: Family Medicine

## 2018-01-26 ENCOUNTER — Inpatient Hospital Stay: Admit: 2018-01-26 | Payer: MEDICARE | Primary: Family Medicine

## 2018-01-28 ENCOUNTER — Inpatient Hospital Stay: Admit: 2018-01-28 | Payer: MEDICARE | Primary: Family Medicine

## 2018-02-02 ENCOUNTER — Inpatient Hospital Stay: Admit: 2018-02-02 | Payer: MEDICARE | Primary: Family Medicine

## 2018-02-02 NOTE — Other (Signed)
Pt getting ready to leave with LYFT, came out of bathroom and said she had a stabbing pain center of chest. No SOB,no nausea,no pain elsewhere.States she had pain when she breathes in 6/10. After 3 cups of water she said chest pain was 3 and 0 when walking out door to get ride.    Vitals at 1435-Bp 128/64,hr 61,O2 sats 95%.Placed pt on monitor -SB/SR no ectopy.

## 2018-02-04 ENCOUNTER — Inpatient Hospital Stay: Admit: 2018-02-04 | Payer: MEDICARE | Primary: Family Medicine

## 2018-02-08 ENCOUNTER — Inpatient Hospital Stay: Admit: 2018-02-08 | Payer: MEDICARE | Primary: Family Medicine

## 2018-02-08 NOTE — Other (Signed)
Claudia [name]  Completed phase II Pulmonary rehab and attended 33 sessions.   Claudia [name] is interested in maintaining optimal health and will work with Raynelle Dick MD. Claudia [name] has  improved her endurance and stamina through regular exercise,pt gain 14.6  lbs and gain 4 inches in her waist. Blood pressure is 104/60 and is WNL.   She has also improved her nutrition, CAT and depression scores and these were reviewed with patient.   Claudia [name] plans to continue exercising at home and has set revised goals that include to start a home exercise program, including therabands, to continue with a HH diet and to become smoke free.  Calton Golds, RT  02/08/2018     COPD Assessment Tool (CAT)     0-5           I never cough/I cough all the time       Score: 2      I have no phlegm in my chest/ My chest is completely full of phlegm  Score: 3      My chest does not feel tight at all/ My chest feels very tight   Score: 0    When I walk up a hill or stairs I am bot breathless/   When I walk up a hill or stairs I am very breathless    Score: 4      I am not limited doing any activities at home/   I am very limited doing activities at home     Score: 2      I am confident leaving my home despite my lung condition/  I am not at all confident leaving my home because of my lung condition Score: 4    I sleep soundly/ I don't sleep because of my lung condition   Score: 1      I have lots of energy/ I have no energy at all     Score: 4                   Total: 20

## 2018-02-11 ENCOUNTER — Encounter: Payer: MEDICARE | Primary: Family Medicine

## 2018-02-12 ENCOUNTER — Encounter: Attending: Registered" | Primary: Family Medicine

## 2018-02-18 ENCOUNTER — Inpatient Hospital Stay: Payer: MEDICARE | Primary: Family Medicine

## 2018-03-01 ENCOUNTER — Inpatient Hospital Stay: Admit: 2018-03-01 | Payer: MEDICARE | Primary: Family Medicine

## 2018-03-01 DIAGNOSIS — E119 Type 2 diabetes mellitus without complications: Secondary | ICD-10-CM

## 2018-04-12 ENCOUNTER — Encounter: Primary: Family Medicine

## 2018-04-15 ENCOUNTER — Inpatient Hospital Stay: Admit: 2018-04-15 | Payer: MEDICARE | Attending: Registered" | Primary: Family Medicine

## 2018-04-15 DIAGNOSIS — E119 Type 2 diabetes mellitus without complications: Secondary | ICD-10-CM

## 2018-04-15 NOTE — Other (Signed)
[facility]      04/15/2018        Dear Tyler Aas MD,      Thank you for your?? kind referral.?? Your patient, Tallie Dodds, has completed his/her personal initial comprehensive education plan.The education plan included the following topics: Healthy??Eating, Being Active, Taking Medicines, Monitoring, Reducing Risks,??Healthy Coping and Problem Solving.  ??    Data from visit:    Weight: 12/08/2017 150.6#; 04/15/2018 157.4 #    HgbA1c: 12/08/2017 5.1%, 04/15/2018 6 %        Increased risk for diabetes: 5.7-6.4 %,?? Diabetes: >6.4%    Glycemic control for adults with diabetes: <7%???? Elderly or multiple medical conditions: <8%    Your patient continued the following goal(s) from their first class:??Goal 1: get rid of sugar free candies in house    Education Learning Outcomes for All Education Topics(see above): Demonstrated Competency    If you have any questions, please do not hesitate to call the [place] [place] at [phone].    ??    Sincerely,?????????????? Joaquin Bend, RD, [place]    [address] [place] [address]    Phone: [phone]?? Fax: [phone]

## 2018-04-15 NOTE — Group Note (Signed)
 Diabetes Mgmt by Dodson Arvin ORN, RD at 04/15/18 1300                Author: Dodson Arvin ORN, RD  Service: --  Author Type: Registered Dietitian       Filed: 04/15/18 1413  Date of Service: 04/15/18 1300  Status: Signed          Editor: Dodson Arvin ORN, RD (Registered Dietitian)                       [facility]         04/15/2018            Dear Elveria Ditch MD,         Thank you for your kind referral. Your patient, Claudia [name], has completed his/her personal initial comprehensive education plan.The education plan included the following topics: HealthyEating, Being Active, Taking Medicines, Monitoring,  Reducing Risks,Healthy Coping and Problem Solving.         Data from visit:      Weight: 12/08/2017 150.6#; 04/15/2018 157.4 #      HgbA1c: 12/08/2017 5.1%, 04/15/2018 6 %            Increased risk for diabetes: 5.7-6.4 %, Diabetes: >6.4%      Glycemic control for adults with diabetes: <7% Elderly or multiple medical conditions: <8%      Your patient continued the following goal(s) from their first class:Goal 1: get rid of sugar free candies in house      Education Learning Outcomes for All Education Topics(see above): Demonstrated Competency      If you have any questions, please do not hesitate to call the [facility] at [phone].            Sincerely, Arvin Dodson, RD, CDE         [facility]      [address]   [address] [place], [place] [address]      Phone: [phone] Fax: [phone]

## 2018-07-02 ENCOUNTER — Emergency Department: Admit: 2018-07-03 | Payer: MEDICARE | Primary: Family Medicine

## 2018-07-02 DIAGNOSIS — R0789 Other chest pain: Secondary | ICD-10-CM

## 2018-07-02 NOTE — ED Notes (Signed)
Assumed care of pt. Plan of care discussed. Call bell in reach. Will continue to monitor.

## 2018-07-03 ENCOUNTER — Inpatient Hospital Stay
Admit: 2018-07-03 | Discharge: 2018-07-03 | Disposition: A | Discharge status: Home / Self Care | Payer: MEDICARE | Attending: Emergency Medicine

## 2018-07-03 LAB — CBC WITH AUTOMATED DIFF
ABS. BASOPHILS: 0 10*3/uL (ref 0.0–0.1)
ABS. EOSINOPHILS: 0.2 10*3/uL (ref 0.0–0.4)
ABS. IMM. GRANS.: 0.1 10*3/uL — ABNORMAL HIGH (ref 0.00–0.04)
ABS. LYMPHOCYTES: 2.1 10*3/uL (ref 0.8–3.5)
ABS. MONOCYTES: 0.5 10*3/uL (ref 0.0–1.0)
ABS. NEUTROPHILS: 4.9 10*3/uL (ref 1.8–8.0)
ABSOLUTE NRBC: 0 10*3/uL (ref 0.00–0.01)
BASOPHILS: 1 % (ref 0–1)
EOSINOPHILS: 3 % (ref 0–7)
HCT: 41.6 % (ref 35.0–47.0)
HGB: 13.5 g/dL (ref 11.5–16.0)
IMMATURE GRANULOCYTES: 1 % — ABNORMAL HIGH (ref 0.0–0.5)
LYMPHOCYTES: 27 % (ref 12–49)
MCH: 30.1 PG (ref 26.0–34.0)
MCHC: 32.5 g/dL (ref 30.0–36.5)
MCV: 92.7 FL (ref 80.0–99.0)
MONOCYTES: 7 % (ref 5–13)
MPV: 9.4 FL (ref 8.9–12.9)
NEUTROPHILS: 61 % (ref 32–75)
NRBC: 0 PER 100 WBC
PLATELET: 244 10*3/uL (ref 150–400)
RBC: 4.49 M/uL (ref 3.80–5.20)
RDW: 12 % (ref 11.5–14.5)
WBC: 7.8 10*3/uL (ref 3.6–11.0)

## 2018-07-03 LAB — TROPONIN I: Troponin-I, Qt.: <0.05 ng/mL (ref <0.05)

## 2018-07-03 LAB — METABOLIC PANEL, COMPREHENSIVE
A-G Ratio: 1 — ABNORMAL LOW (ref 1.1–2.2)
ALT (SGPT): 20 U/L (ref 12–78)
AST (SGOT): 18 U/L (ref 15–37)
Albumin: 3.6 g/dL (ref 3.5–5.0)
Alk. phosphatase: 86 U/L (ref 45–117)
Anion gap: 0 mmol/L — ABNORMAL LOW (ref 5–15)
BUN/Creatinine ratio: 13 (ref 12–20)
BUN: 10 MG/DL (ref 6–20)
Bilirubin, total: 0.2 MG/DL (ref 0.2–1.0)
CO2: 36 mmol/L — ABNORMAL HIGH (ref 21–32)
Calcium: 9.1 MG/DL (ref 8.5–10.1)
Chloride: 102 mmol/L (ref 97–108)
Creatinine: 0.8 MG/DL (ref 0.55–1.02)
GFR est AA: >60 mL/min/{1.73_m2} (ref >60)
GFR est non-AA: >60 mL/min/{1.73_m2} (ref >60)
Globulin: 3.6 g/dL (ref 2.0–4.0)
Glucose: 97 mg/dL (ref 65–100)
Potassium: 4.1 mmol/L (ref 3.5–5.1)
Protein, total: 7.2 g/dL (ref 6.4–8.2)
Sodium: 138 mmol/L (ref 136–145)

## 2018-07-03 LAB — EKG, 12 LEAD, INITIAL
Atrial Rate: 49 {beats}/min
Calculated P Axis: 84 degrees
Calculated R Axis: -57 degrees
Calculated T Axis: 49 degrees
P-R Interval: 162 ms
Q-T Interval: 474 ms
QRS Duration: 78 ms
QTC Calculation (Bezet): 428 ms
Ventricular Rate: 49 {beats}/min

## 2018-07-03 LAB — POC TROPONIN-I
POC Troponin I: <0.04 ng/mL (ref 0.00–0.08)
Troponin-I (POC): <0.04 ng/mL (ref 0.00–0.08)

## 2018-07-03 LAB — CBC WITH AUTO DIFFERENTIAL
Basophils %: 1 % (ref 0–1)
Basophils Absolute: 0 10*3/uL (ref 0.0–0.1)
Eosinophils %: 3 % (ref 0–7)
Eosinophils Absolute: 0.2 10*3/uL (ref 0.0–0.4)
Granulocyte Absolute Count: 0.1 10*3/uL — ABNORMAL HIGH (ref 0.00–0.04)
Hematocrit: 41.6 % (ref 35.0–47.0)
Hemoglobin: 13.5 g/dL (ref 11.5–16.0)
Immature Granulocytes: 1 % — ABNORMAL HIGH (ref 0.0–0.5)
Lymphocytes %: 27 % (ref 12–49)
Lymphocytes Absolute: 2.1 10*3/uL (ref 0.8–3.5)
MCH: 30.1 PG (ref 26.0–34.0)
MCHC: 32.5 g/dL (ref 30.0–36.5)
MCV: 92.7 FL (ref 80.0–99.0)
MPV: 9.4 FL (ref 8.9–12.9)
Monocytes %: 7 % (ref 5–13)
Monocytes Absolute: 0.5 10*3/uL (ref 0.0–1.0)
NRBC Absolute: 0 10*3/uL (ref 0.00–0.01)
Neutrophils %: 61 % (ref 32–75)
Neutrophils Absolute: 4.9 10*3/uL (ref 1.8–8.0)
Nucleated RBCs: 0 PER 100 WBC
Platelets: 244 10*3/uL (ref 150–400)
RBC: 4.49 M/uL (ref 3.80–5.20)
RDW: 12 % (ref 11.5–14.5)
WBC: 7.8 10*3/uL (ref 3.6–11.0)

## 2018-07-03 LAB — COMPREHENSIVE METABOLIC PANEL
ALT: 20 U/L (ref 12–78)
AST: 18 U/L (ref 15–37)
Albumin/Globulin Ratio: 1 — ABNORMAL LOW (ref 1.1–2.2)
Albumin: 3.6 g/dL (ref 3.5–5.0)
Alkaline Phosphatase: 86 U/L (ref 45–117)
Anion Gap: 0 mmol/L — ABNORMAL LOW (ref 5–15)
BUN: 10 MG/DL (ref 6–20)
Bun/Cre Ratio: 13 (ref 12–20)
CO2: 36 mmol/L — ABNORMAL HIGH (ref 21–32)
Calcium: 9.1 MG/DL (ref 8.5–10.1)
Chloride: 102 mmol/L (ref 97–108)
Creatinine: 0.8 MG/DL (ref 0.55–1.02)
EGFR IF NonAfrican American: >60 mL/min/{1.73_m2} (ref >60)
GFR African American: >60 mL/min/{1.73_m2} (ref >60)
Globulin: 3.6 g/dL (ref 2.0–4.0)
Glucose: 97 mg/dL (ref 65–100)
Potassium: 4.1 mmol/L (ref 3.5–5.1)
Sodium: 138 mmol/L (ref 136–145)
Total Bilirubin: 0.2 MG/DL (ref 0.2–1.0)
Total Protein: 7.2 g/dL (ref 6.4–8.2)

## 2018-07-03 LAB — EKG 12-LEAD
Atrial Rate: 49 {beats}/min
P Axis: 84 degrees
P-R Interval: 162 ms
Q-T Interval: 474 ms
QRS Duration: 78 ms
QTc Calculation (Bazett): 428 ms
R Axis: -57 degrees
T Axis: 49 degrees
Ventricular Rate: 49 {beats}/min

## 2018-07-03 LAB — TROPONIN: Troponin I: <0.05 ng/mL (ref <0.05)

## 2018-07-03 MED ORDER — KETOROLAC TROMETHAMINE 30 MG/ML INJECTION
30 mg/mL (1 mL) | Freq: Once | INTRAMUSCULAR | Status: AC
Start: 2018-07-03 — End: 2018-07-02
  Administered 2018-07-03: 04:00:00 via INTRAVENOUS

## 2018-07-03 MED FILL — KETOROLAC TROMETHAMINE 30 MG/ML INJECTION: 30 mg/mL (1 mL) | INTRAMUSCULAR | Qty: 1

## 2018-07-03 NOTE — ED Provider Notes (Signed)
EMERGENCY DEPARTMENT HISTORY AND PHYSICAL EXAM      Date: 07/02/2018  Patient Name: Claudia [name]  Patient Age and Sex: 63 y.o. female    History of Presenting Illness     Chief Complaint   Patient presents with   ??? Chest Pain     Pt reports L sided CP since 6pm.        History Provided By: Patient    Ability to gather history was limited by:     HPI: Claudia [name], 63 y.o. female with history of coronary artery disease, MI, with 1 prior stent, schizophrenia, COPD, complains of chest pain under her left breast, starting about 6 hours ago.  No significant shortness of breath or wheezing or fevers or cough.  Pain is aching, 5 out of 10 severity, focal and nonradiating, constant for about 6 hours.      Location:    Quality:      Severity:    Duration:   Timing:      Context:    Modifying factors:   Associated symptoms:     Pt denies any other alleviating or exacerbating factors. There are no other complaints, changes or physical findings at this time.     Past Medical History:   Diagnosis Date   ??? Arthritis     unknown type - back   ??? CAD (coronary artery disease)      stent placed in 2006 [place]; followed by Dr Henriette Combs   ??? Cancer [facility])     colon ca   ??? Chronic obstructive pulmonary disease ([place])     managed by PCP   ??? Hypercholesteremia    ??? MI (myocardial infarction) ([place]) 2006    in [place]   ??? Psychiatric disorder     schizophrenia   ??? Schizophrenia ([place])     Dr Beverlee Nims on Lauralyn Primes     Past Surgical History:   Procedure Laterality Date   ??? CARDIAC SURG PROCEDURE UNLIST  2006    stent placed   ??? COLONOSCOPY N/A 08/04/2017    COLONOSCOPY performed by Eugenie Filler., MD at [facility]   ??? COLONOSCOPY,REMV LESN,SNARE  08/04/2017        ??? COLORECTAL SCRN; HI RISK IND  11/01/2013        ??? COLORECTAL SCRN; HI RISK IND  08/04/2017        ??? HX HEENT      teeth removal       PCP: Valera Castle, MD    Past History     Past Medical History:  Past Medical History:   Diagnosis Date   ??? Arthritis      unknown type - back   ??? CAD (coronary artery disease)      stent placed in 2006 [place]; followed by Dr Henriette Combs   ??? Cancer [facility])     colon ca   ??? Chronic obstructive pulmonary disease ([place])     managed by PCP   ??? Hypercholesteremia    ??? MI (myocardial infarction) ([place]) 2006    in [place]   ??? Psychiatric disorder     schizophrenia   ??? Schizophrenia ([place])     Dr Beverlee Nims on Lauralyn Primes       Past Surgical History:  Past Surgical History:   Procedure Laterality Date   ??? CARDIAC SURG PROCEDURE UNLIST  2006    stent placed   ??? COLONOSCOPY N/A 08/04/2017  COLONOSCOPY performed by Eugenie Filler., MD at [facility]   ??? COLONOSCOPY,REMV LESN,SNARE  08/04/2017        ??? COLORECTAL SCRN; HI RISK IND  11/01/2013        ??? COLORECTAL SCRN; HI RISK IND  08/04/2017        ??? HX HEENT      teeth removal       Family History:  Family History   Problem Relation Age of Onset   ??? Heart Attack Father    ??? Cancer Mother         breast   ??? Breast Cancer Mother         51's   ??? Cancer Brother         kidney       Social History:  Social History     Tobacco Use   ??? Smoking status: Current Every Day Smoker     Packs/day: 1.50     Years: 42.00     Pack years: 63.00   ??? Smokeless tobacco: Never Used   Substance Use Topics   ??? Alcohol use: Yes   ??? Drug use: No       Allergies:  No Known Allergies    Current Medications:  No current facility-administered medications on file prior to encounter.      Current Outpatient Medications on File Prior to Encounter   Medication Sig Dispense Refill   ??? lidocaine (LIDODERM) 5 % Apply patch to the affected area for 12 hours a day and remove for 12 hours a day. 5 Each 0   ??? traZODone (DESYREL) 100 mg tablet Take 100 mg by mouth nightly.     ??? loratadine (CLARITIN) 10 mg tablet Take 10 mg by mouth daily.     ??? nicotine polacrilex 4 mg lozenge 4 mg by Buccal route as needed for Smoking Cessation.     ??? promethazine-dextromethorphan (PROMETHAZINE-DM) 6.25-15 mg/5 mL syrup  Take 5 mL by mouth every four (4) hours as needed for Cough.     ??? ezetimibe (ZETIA) 10 mg tablet Take 5 mg by mouth daily.     ??? albuterol (PROVENTIL VENTOLIN) 2.5 mg /3 mL (0.083 %) nebulizer solution 2.5 mg by Nebulization route every six (6) hours.     ??? docusate sodium (COLACE) 100 mg capsule Take 1 Cap by mouth two (2) times a day. 30 Cap 0   ??? tiotropium (SPIRIVA WITH HANDIHALER) 18 mcg inhalation capsule Take 1 Cap by inhalation daily.     ??? atorvastatin (LIPITOR) 40 mg tablet Take  by mouth every morning. Indications: HYPERCHOLESTEROLEMIA     ??? cholecalciferol (VITAMIN D3) 1,000 unit tablet Take  by mouth daily.     ??? aspirin 81 mg tablet Take 81 mg by mouth daily.     ??? citalopram (CELEXA) 40 mg tablet Take 40 mg by mouth every morning.     ??? benztropine (COGENTIN) 0.5 mg tablet Take 0.5 mg by mouth nightly.     ??? olanzapine (ZYPREXA) 15 mg tablet Take 15 mg by mouth nightly. Indications: SCHIZOPHRENIA         Review of Systems   Review of Systems   Constitutional: Negative for fever.   Respiratory: Negative for shortness of breath.    Cardiovascular: Positive for chest pain.   All other systems reviewed and are negative.      Physical Exam   Vital Signs  Patient Vitals for the past 24 hrs:  Temp Pulse Resp BP SpO2   07/02/18 2315 98.1 ??F (36.7 ??C) (!) 44 9 128/51 ???   07/02/18 2300 ??? (!) 48 11 115/58 ???   07/02/18 2144 97.6 ??F (36.4 ??C) (!) 53 15 132/61 96 %       Physical Exam  Vitals signs and nursing note reviewed.   Constitutional:       General: She is not in acute distress.     Appearance: She is well-developed.   HENT:      Head: Normocephalic and atraumatic.      Mouth/Throat:      Mouth: Mucous membranes are moist.   Eyes:      General:         Right eye: No discharge.         Left eye: No discharge.      Conjunctiva/sclera: Conjunctivae normal.   Neck:      Musculoskeletal: Normal range of motion and neck supple.   Cardiovascular:      Rate and Rhythm: Normal rate and regular rhythm.       Heart sounds: Normal heart sounds. No murmur.   Pulmonary:      Effort: Pulmonary effort is normal. No respiratory distress.      Breath sounds: Normal breath sounds. No wheezing.   Abdominal:      General: There is no distension.      Palpations: Abdomen is soft.      Tenderness: There is no abdominal tenderness.   Musculoskeletal: Normal range of motion.         General: No deformity.   Skin:     General: Skin is warm and dry.      Findings: No rash.   Neurological:      Mental Status: She is alert and oriented to person, place, and time.   Psychiatric:         Behavior: Behavior normal.         Thought Content: Thought content normal.         Diagnostic Study Results   Labs  Recent Results (from the past 24 hour(s))   EKG, 12 LEAD, INITIAL    Collection Time: 07/02/18  9:39 PM   Result Value Ref Range    Ventricular Rate 49 BPM    Atrial Rate 49 BPM    P-R Interval 162 ms    QRS Duration 78 ms    Q-T Interval 474 ms    QTC Calculation (Bezet) 428 ms    Calculated P Axis 84 degrees    Calculated R Axis -57 degrees    Calculated T Axis 49 degrees    Diagnosis       Sinus bradycardia  Left axis deviation  When compared with ECG of 19-Jan-2015 03:31,  No significant change was found     CBC WITH AUTOMATED DIFF    Collection Time: 07/02/18 10:13 PM   Result Value Ref Range    WBC 7.8 3.6 - 11.0 K/uL    RBC 4.49 3.80 - 5.20 M/uL    HGB 13.5 11.5 - 16.0 g/dL    HCT 41.6 35.0 - 47.0 %    MCV 92.7 80.0 - 99.0 FL    MCH 30.1 26.0 - 34.0 PG    MCHC 32.5 30.0 - 36.5 g/dL    RDW 12.0 11.5 - 14.5 %    PLATELET 244 150 - 400 K/uL    MPV 9.4 8.9 - 12.9 FL    NRBC 0.0 0  PER 100 WBC    ABSOLUTE NRBC 0.00 0.00 - 0.01 K/uL    NEUTROPHILS 61 32 - 75 %    LYMPHOCYTES 27 12 - 49 %    MONOCYTES 7 5 - 13 %    EOSINOPHILS 3 0 - 7 %    BASOPHILS 1 0 - 1 %    IMMATURE GRANULOCYTES 1 (H) 0.0 - 0.5 %    ABS. NEUTROPHILS 4.9 1.8 - 8.0 K/UL    ABS. LYMPHOCYTES 2.1 0.8 - 3.5 K/UL    ABS. MONOCYTES 0.5 0.0 - 1.0 K/UL     ABS. EOSINOPHILS 0.2 0.0 - 0.4 K/UL    ABS. BASOPHILS 0.0 0.0 - 0.1 K/UL    ABS. IMM. GRANS. 0.1 (H) 0.00 - 0.04 K/UL    DF AUTOMATED     METABOLIC PANEL, COMPREHENSIVE    Collection Time: 07/02/18 10:13 PM   Result Value Ref Range    Sodium 138 136 - 145 mmol/L    Potassium 4.1 3.5 - 5.1 mmol/L    Chloride 102 97 - 108 mmol/L    CO2 36 (H) 21 - 32 mmol/L    Anion gap 0 (L) 5 - 15 mmol/L    Glucose 97 65 - 100 mg/dL    BUN 10 6 - 20 MG/DL    Creatinine 0.80 0.55 - 1.02 MG/DL    BUN/Creatinine ratio 13 12 - 20      GFR est AA >60 >60 ml/min/1.53m    GFR est non-AA >60 >60 ml/min/1.732m   Calcium 9.1 8.5 - 10.1 MG/DL    Bilirubin, total 0.2 0.2 - 1.0 MG/DL    ALT (SGPT) 20 12 - 78 U/L    AST (SGOT) 18 15 - 37 U/L    Alk. phosphatase 86 45 - 117 U/L    Protein, total 7.2 6.4 - 8.2 g/dL    Albumin 3.6 3.5 - 5.0 g/dL    Globulin 3.6 2.0 - 4.0 g/dL    A-G Ratio 1.0 (L) 1.1 - 2.2     POC TROPONIN-I    Collection Time: 07/02/18 10:13 PM   Result Value Ref Range    Troponin-I (POC) <0.04 0.00 - 0.08 ng/mL   TROPONIN I    Collection Time: 07/03/18 12:54 AM   Result Value Ref Range    Troponin-I, Qt. <0.05 <0.05 ng/mL       Radiologic Studies  XR CHEST PA LAT   Final Result   IMPRESSION: Normal chest.           CT Results  (Last 48 hours)    None        CXR Results  (Last 48 hours)               07/02/18 2240  XR CHEST PA LAT Final result    Impression:  IMPRESSION: Normal chest.           Narrative:  INDICATION: Chest pain       COMPARISON: January 18, 2015       FINDINGS: PA and lateral views of the chest demonstrate a stable   cardiomediastinal silhouette and clear lungs bilaterally. The visualized osseous   structures are unremarkable.                 Procedures   EKG  Date/Time: 07/03/2018 12:10 AM  Performed by: BeHezzie BumpMD  Authorized by: BeHezzie BumpMD     ECG reviewed by ED Physician in the absence  of a cardiologist: yes    Interpretation:     Interpretation: non-specific    Rate:      ECG rate assessment: bradycardic    Rhythm:     Rhythm: sinus rhythm and sinus bradycardia    Ectopy:     Ectopy: none    QRS:     QRS axis:  Normal  ST segments:     ST segments:  Normal  T waves:     T waves: normal    EKG  Date/Time: 07/03/2018 1:57 AM  Performed by: Hezzie Bump, MD  Authorized by: Hezzie Bump, MD     ECG reviewed by ED Physician in the absence of a cardiologist: yes    Interpretation:     Interpretation: non-specific    Rate:     ECG rate assessment: bradycardic    Rhythm:     Rhythm: sinus rhythm and sinus bradycardia    Ectopy:     Ectopy: none    QRS:     QRS axis:  Normal  ST segments:     ST segments:  Normal  T waves:     T waves: normal          Medical Decision Making     Provider Notes (Medical Decision Making):   63 year old female with coronary artery disease with 1 stent, complaining of about 6 hours of left-sided chest pain.    On exam she is well-appearing, no apparent distress.  She is bradycardic, but has a history of bradycardia.    Serial EKGs and troponins are without any signs of acute ischemia.  No significant clinical concern for ACS or myocardial infarction, despite her underlying risk factors.  No evidence of pneumothorax, pneumonia, or other pulmonary cause of her pain, and PE is not clinically suspected.    Of note she reports a minor fall a few days ago, and I suspect that her pain is all secondary to muscle strain.  No evidence of rib fracture or significant contusion.  Stable for discharge home, ibuprofen or Tylenol as needed.    Marijean Bravo, MD  1:45 AM          Consults:      Medications Administered During ED Course:  Medications   ketorolac (TORADOL) injection 30 mg (30 mg IntraVENous Given 07/02/18 2311)          Diagnosis and Disposition     Disposition:  Discharged    Clinical Impression:   1. Atypical chest pain    2. Chest wall pain    3. Sinus bradycardia        Attestation:   I personally performed the services described in this documentation on this date 07/02/2018 for patient ALIXANDREA MILLESON.    Marijean Bravo, MD        I was the first provider for this patient on this visit.  To the best of my ability I reviewed relevant prior medical records, electrocardiograms, laboratories, and radiologic studies.  The patient's presenting problems were discussed, and the patient was in agreement with the care plan formulated and outlined with them.      Marijean Bravo, MD    Please note that this dictation was completed with Dragon voice recognition software. Quite often unanticipated grammatical, syntax, homophones, and other interpretive errors are inadvertently transcribed by the computer software. Please disregard these errors and excuse any errors that have escaped final proofreading.

## 2018-07-03 NOTE — ED Notes (Signed)
I have reviewed discharge instructions with the patient.  The patient verbalized understanding.

## 2018-07-03 NOTE — ED Notes (Signed)
I have reviewed discharge instructions with the patient.  The patient verbalized understanding.

## 2018-07-03 NOTE — ED Provider Notes (Signed)
ED Provider Notes by Hezzie Bump, MD at 07/03/18 0145                Author: Hezzie Bump, MD  Service: --  Author Type: Physician       Filed: 07/03/18 0159  Date of Service: 07/03/18 0145  Status: Signed          Editor: Hezzie Bump, MD (Physician)            Procedure Orders        1. EKG [[id number]] ordered by Hezzie Bump, MD        2. EKG [[id number]] ordered by Hezzie Bump, MD                              EMERGENCY DEPARTMENT HISTORY AND PHYSICAL EXAM           Date: 07/02/2018   Patient Name: Claudia [name]   Patient Age and Sex: 63 y.o.  female        History of Presenting Illness          Chief Complaint       Patient presents with        ?  Chest Pain             Pt reports L sided CP since 6pm.            History Provided By: Patient      Ability to gather history was limited by:       HPI: Claudia [name],  63 y.o. female with history of coronary artery disease, MI, with 1 prior stent, schizophrenia,  COPD, complains of chest pain under her left breast, starting about 6 hours ago.  No significant shortness of breath or wheezing or fevers or cough.  Pain is aching, 5 out of 10 severity, focal and nonradiating, constant for about 6 hours.        Location:     Quality:       Severity:     Duration:    Timing:       Context:     Modifying factors:    Associated symptoms:       Pt denies any other alleviating or exacerbating factors. There are no other complaints, changes or physical findings at this time.         Past Medical History:        Diagnosis  Date         ?  Arthritis            unknown type - back         ?  CAD (coronary artery disease)             stent placed in 2006 [place]; followed by Dr Henriette Combs         ?  Cancer [facility])            colon ca         ?  Chronic obstructive pulmonary disease ([place])            managed by PCP         ?  Hypercholesteremia       ?  MI (myocardial infarction) ([place])  2006          in [place]         ?  Psychiatric  disorder  schizophrenia         ?  Schizophrenia ([place])            Dr Beverlee Nims on La[facility] Primes          Past Surgical History:         Procedure  Laterality  Date          ?  CARDIAC SURG PROCEDURE UNLIST    2006          stent placed          ?  COLONOSCOPY  N/A  08/04/2017          COLONOSCOPY performed by Eugenie Filler., MD at [facility]          ?  COLONOSCOPY,REMV LESN,SNARE    08/04/2017                     ?  COLORECTAL SCRN; HI RISK IND    11/01/2013                     ?  COLORECTAL SCRN; HI RISK IND    08/04/2017                     ?  HX HEENT              teeth removal           PCP: Valera Castle, MD        Past History        Past Medical History:     Past Medical History:        Diagnosis  Date         ?  Arthritis            unknown type - back         ?  CAD (coronary artery disease)             stent placed in 2006 [place]; followed by Dr Henriette Combs         ?  Cancer [facility])            colon ca         ?  Chronic obstructive pulmonary disease ([place])            managed by PCP         ?  Hypercholesteremia       ?  MI (myocardial infarction) ([place])  2006          in [place]         ?  Psychiatric disorder            schizophrenia         ?  Schizophrenia ([place])            Dr Beverlee Nims on Lauralyn Primes           Past Surgical History:     Past Surgical History:         Procedure  Laterality  Date          ?  CARDIAC SURG PROCEDURE UNLIST    2006          stent placed          ?  COLONOSCOPY  N/A  08/04/2017          COLONOSCOPY performed by Eugenie Filler., MD at [facility]          ?  COLONOSCOPY,REMV LESN,SNARE    08/04/2017                     ?  COLORECTAL SCRN; HI RISK IND    11/01/2013                     ?  COLORECTAL SCRN; HI RISK IND    08/04/2017                     ?  HX HEENT              teeth removal           Family History:     Family History         Problem  Relation  Age of Onset          ?  Heart Attack  Father       ?  Cancer  Mother                 breast          ?  Breast Cancer  Mother                32's          ?  Cancer  Brother                kidney           Social History:     Social History          Tobacco Use         ?  Smoking status:  Current Every Day Smoker              Packs/day:  1.50         Years:  42.00         Pack years:  63.00         ?  Smokeless tobacco:  Never Used       Substance Use Topics         ?  Alcohol use:  Yes         ?  Drug use:  No           Allergies:   No Known Allergies      Current Medications:     No current facility-administered medications on file prior to encounter.           Current Outpatient Medications on File Prior to Encounter          Medication  Sig  Dispense  Refill           ?  lidocaine (LIDODERM) 5 %  Apply patch to the affected area for 12 hours a day and remove for 12 hours a day.  5 Each  0     ?  traZODone (DESYREL) 100 mg tablet  Take 100 mg by mouth nightly.         ?  loratadine (CLARITIN) 10 mg tablet  Take 10 mg by mouth daily.         ?  nicotine polacrilex 4 mg lozenge  4 mg by Buccal route as needed for Smoking Cessation.         ?  promethazine-dextromethorphan (PROMETHAZINE-DM) 6.25-15 mg/5 mL syrup  Take 5 mL by mouth every four (4) hours as needed for Cough.         ?  ezetimibe (ZETIA) 10  mg tablet  Take 5 mg by mouth daily.         ?  albuterol (PROVENTIL VENTOLIN) 2.5 mg /3 mL (0.083 %) nebulizer solution  2.5 mg by Nebulization route every six (6) hours.         ?  docusate sodium (COLACE) 100 mg capsule  Take 1 Cap by mouth two (2) times a day.  30 Cap  0     ?  tiotropium (SPIRIVA WITH HANDIHALER) 18 mcg inhalation capsule  Take 1 Cap by inhalation daily.         ?  atorvastatin (LIPITOR) 40 mg tablet  Take  by mouth every morning. Indications: HYPERCHOLESTEROLEMIA         ?  cholecalciferol (VITAMIN D3) 1,000 unit tablet  Take  by mouth daily.         ?  aspirin 81 mg tablet  Take 81 mg by mouth daily.         ?  citalopram (CELEXA) 40 mg tablet  Take 40 mg by mouth every  morning.         ?  benztropine (COGENTIN) 0.5 mg tablet  Take 0.5 mg by mouth nightly.               ?  olanzapine (ZYPREXA) 15 mg tablet  Take 15 mg by mouth nightly. Indications: SCHIZOPHRENIA                 Review of Systems     Review of Systems    Constitutional: Negative for fever.    Respiratory: Negative for shortness of breath.     Cardiovascular: Positive for chest pain.    All other systems reviewed and are negative.           Physical Exam     Vital Signs   Patient Vitals for the past 24 hrs:            Temp  Pulse  Resp  BP  SpO2            07/02/18 2315  98.1 ??F (36.7 ??C)  (!) 44  9  128/51  --            07/02/18 2300  --  (!) 48  11  115/58  --            07/02/18 2144  97.6 ??F (36.4 ??C)  (!) 53  15  132/61  96 %           Physical Exam   Vitals signs and nursing note reviewed.   Constitutional:        General: She is not in acute distress.     Appearance: She is well-developed.    HENT:       Head: Normocephalic and atraumatic.      Mouth/Throat:      Mouth: Mucous membranes are moist.    Eyes:       General:         Right eye: No discharge.         Left eye: No discharge.      Conjunctiva/sclera: Conjunctivae normal.    Neck:       Musculoskeletal: Normal range of motion and neck supple.   Cardiovascular :       Rate and Rhythm: Normal rate and regular rhythm.      Heart sounds: Normal heart sounds. No murmur.    Pulmonary:       Effort:  Pulmonary effort is normal. No respiratory distress.      Breath sounds: Normal breath sounds. No wheezing.    Abdominal:      General: There is no distension.      Palpations: Abdomen is soft.      Tenderness: There is no abdominal tenderness.     Musculoskeletal: Normal range of motion.          General: No deformity.    Skin:      General: Skin is warm and dry.      Findings: No rash.    Neurological:       Mental Status: She is alert and oriented to person, place, and time.    Psychiatric:         Behavior: Behavior normal.         Thought Content: Thought  content normal.               Diagnostic Study Results     Labs     Recent Results (from the past 24 hour(s))     EKG, 12 LEAD, INITIAL          Collection Time: 07/02/18  9:39 PM         Result  Value  Ref Range            Ventricular Rate  49  BPM       Atrial Rate  49  BPM       P-R Interval  162  ms       QRS Duration  78  ms       Q-T Interval  474  ms       QTC Calculation ([place])  428  ms       Calculated P Axis  84  degrees       Calculated R Axis  -57  degrees       Calculated T Axis  49  degrees       Diagnosis                 Sinus bradycardia   Left axis deviation   When compared with ECG of 19-Jan-2015 03:31,   No significant change was found          CBC WITH AUTOMATED DIFF          Collection Time: 07/02/18 10:13 PM         Result  Value  Ref Range            WBC  7.8  3.6 - 11.0 K/uL       RBC  4.49  3.80 - 5.20 M/uL       HGB  13.5  11.5 - 16.0 g/dL       HCT  41.6  35.0 - 47.0 %       MCV  92.7  80.0 - 99.0 FL       MCH  30.1  26.0 - 34.0 PG       MCHC  32.5  30.0 - 36.5 g/dL       RDW  12.0  11.5 - 14.5 %       PLATELET  244  150 - 400 K/uL       MPV  9.4  8.9 - 12.9 FL       NRBC  0.0  0 PER 100 WBC       ABSOLUTE NRBC  0.00  0.00 - 0.01 K/uL  NEUTROPHILS  61  32 - 75 %       LYMPHOCYTES  27  12 - 49 %       MONOCYTES  7  5 - 13 %       EOSINOPHILS  3  0 - 7 %       BASOPHILS  1  0 - 1 %       IMMATURE GRANULOCYTES  1 (H)  0.0 - 0.5 %       ABS. NEUTROPHILS  4.9  1.8 - 8.0 K/UL       ABS. LYMPHOCYTES  2.1  0.8 - 3.5 K/UL       ABS. MONOCYTES  0.5  0.0 - 1.0 K/UL       ABS. EOSINOPHILS  0.2  0.0 - 0.4 K/UL       ABS. BASOPHILS  0.0  0.0 - 0.1 K/UL       ABS. IMM. GRANS.  0.1 (H)  0.00 - 0.04 K/UL       DF  AUTOMATED          METABOLIC PANEL, COMPREHENSIVE          Collection Time: 07/02/18 10:13 PM         Result  Value  Ref Range            Sodium  138  136 - 145 mmol/L       Potassium  4.1  3.5 - 5.1 mmol/L       Chloride  102  97 - 108 mmol/L       CO2  36 (H)  21 - 32 mmol/L       Anion  gap  0 (L)  5 - 15 mmol/L       Glucose  97  65 - 100 mg/dL       BUN  10  6 - 20 MG/DL       Creatinine  0.80  0.55 - 1.02 MG/DL       BUN/Creatinine ratio  13  12 - 20         GFR est AA  >60  >60 ml/min/1.60m       GFR est non-AA  >60  >60 ml/min/1.728m      Calcium  9.1  8.5 - 10.1 MG/DL       Bilirubin, total  0.2  0.2 - 1.0 MG/DL       ALT (SGPT)  20  12 - 78 U/L       AST (SGOT)  18  15 - 37 U/L       Alk. phosphatase  86  45 - 117 U/L       Protein, total  7.2  6.4 - 8.2 g/dL       Albumin  3.6  3.5 - 5.0 g/dL       Globulin  3.6  2.0 - 4.0 g/dL       A-G Ratio  1.0 (L)  1.1 - 2.2         POC TROPONIN-I          Collection Time: 07/02/18 10:13 PM         Result  Value  Ref Range            Troponin-I (POC)  <0.04  0.00 - 0.08 ng/mL       TROPONIN I          Collection Time: 07/03/18 12:54 AM  Result  Value  Ref Range            Troponin-I, Qt.  <0.05  <0.05 ng/mL           Radiologic Studies     XR CHEST PA LAT       Final Result     IMPRESSION: Normal chest.                      CT Results  (Last 48 hours)          None                 CXR Results  (Last 48  hours)                                    07/02/18 2240    XR CHEST PA LAT  Final result            Impression:    IMPRESSION: Normal chest.                              Narrative:    INDICATION: Chest pain             COMPARISON: January 18, 2015             FINDINGS: PA and lateral views of the chest demonstrate a stable      cardiomediastinal silhouette and clear lungs bilaterally. The visualized osseous      structures are unremarkable.                                    Procedures      EKG  Date/Time:  07/03/2018 12:10 AM   Performed by:  Hezzie Bump, MD   Authorized by:  Hezzie Bump, MD      ECG reviewed by ED  Physician in the absence of a cardiologist: yes     Interpretation:     Interpretation: non-specific     Rate:     ECG rate assessment: bradycardic     Rhythm:     Rhythm: sinus rhythm and sinus bradycardia       Ectopy:     Ectopy: none     QRS:      QRS axis:  Normal   ST segments:      ST segments:  Normal   T waves:     T waves: normal      EKG  Date/Time:  07/03/2018 1:57 AM   Performed by:  Hezzie Bump, MD   Authorized by:  Hezzie Bump, MD      ECG reviewed by ED  Physician in the absence of a cardiologist: yes     Interpretation:     Interpretation: non-specific     Rate:     ECG rate assessment: bradycardic     Rhythm:     Rhythm: sinus rhythm and sinus bradycardia      Ectopy:     Ectopy: none     QRS:      QRS axis:  Normal   ST segments:      ST segments:  Normal   T waves:     T waves: normal  Medical Decision Making        Provider Notes (Medical Decision Making):    63 year old female with coronary artery disease with 1 stent, complaining of about 6 hours of left-sided chest pain.      On exam she is well-appearing, no apparent distress.  She is bradycardic, but has a history of bradycardia.      Serial EKGs and troponins are without any signs of acute ischemia.  No significant clinical concern for ACS or myocardial infarction, despite her underlying risk factors.  No evidence of pneumothorax, pneumonia, or other pulmonary cause of her pain, and  PE is not clinically suspected.      Of note she reports a minor fall a few days ago, and I suspect that her pain is all secondary to muscle strain.  No evidence of rib fracture or significant contusion.  Stable for discharge home, ibuprofen or Tylenol as needed.      Marijean Bravo, MD   1:45 AM               Consults:         Medications Administered During ED Course:     Medications       ketorolac (TORADOL) injection 30 mg (30 mg IntraVENous Given 07/02/18 2311)                 Diagnosis and Disposition        Disposition:  Discharged      Clinical Impression:       1.  Atypical chest pain      2.  Chest wall pain         3.  Sinus bradycardia            Attestation:   I personally performed the services described in this  documentation on this date 07/02/2018 for patient [employer]     Marijean Bravo, MD            I was the first provider for this patient on this visit.  To the best of my ability I reviewed relevant prior medical records, electrocardiograms, laboratories, and radiologic studies.  The patient's  presenting problems were discussed, and the patient was in agreement with the care plan formulated and outlined with them.        Marijean Bravo, MD      Please note that this dictation was completed with Dragon voice recognition software. Quite often unanticipated grammatical, syntax, homophones, and other interpretive errors  are inadvertently transcribed by the computer software. Please disregard these errors and excuse any errors that have escaped final proofreading.

## 2018-07-05 LAB — EKG, 12 LEAD, INITIAL
Atrial Rate: 41 {beats}/min
Calculated P Axis: 86 degrees
Calculated R Axis: -57 degrees
Calculated T Axis: 50 degrees
P-R Interval: 170 ms
Q-T Interval: 508 ms
QRS Duration: 68 ms
QTC Calculation (Bezet): 419 ms
Ventricular Rate: 41 {beats}/min

## 2018-07-05 LAB — EKG 12-LEAD
Atrial Rate: 41 {beats}/min
P Axis: 86 degrees
P-R Interval: 170 ms
Q-T Interval: 508 ms
QRS Duration: 68 ms
QTc Calculation (Bazett): 419 ms
R Axis: -57 degrees
T Axis: 50 degrees
Ventricular Rate: 41 {beats}/min

## 2018-12-31 ENCOUNTER — Encounter

## 2019-02-14 ENCOUNTER — Inpatient Hospital Stay: Admit: 2019-02-14 | Payer: MEDICARE | Attending: Family Medicine | Primary: Family Medicine

## 2019-02-14 DIAGNOSIS — Z1231 Encounter for screening mammogram for malignant neoplasm of breast: Secondary | ICD-10-CM

## 2019-09-30 ENCOUNTER — Encounter

## 2019-12-28 ENCOUNTER — Inpatient Hospital Stay
Admit: 2019-12-28 | Discharge: 2019-12-29 | Disposition: A | Discharge status: Home / Self Care | Payer: MEDICARE | Attending: Emergency Medicine

## 2019-12-28 DIAGNOSIS — J441 Chronic obstructive pulmonary disease with (acute) exacerbation: Secondary | ICD-10-CM

## 2019-12-28 LAB — METABOLIC PANEL, COMPREHENSIVE
A-G Ratio: 1.2 (ref 1.1–2.2)
ALT (SGPT): 22 U/L (ref 12–78)
AST (SGOT): 14 U/L — ABNORMAL LOW (ref 15–37)
Albumin: 3.7 g/dL (ref 3.5–5.0)
Alk. phosphatase: 63 U/L (ref 45–117)
Anion gap: 1 mmol/L — ABNORMAL LOW (ref 5–15)
BUN/Creatinine ratio: 19 (ref 12–20)
BUN: 10 MG/DL (ref 6–20)
Bilirubin, total: 0.2 MG/DL (ref 0.2–1.0)
CO2: 35 mmol/L — ABNORMAL HIGH (ref 21–32)
Calcium: 9 MG/DL (ref 8.5–10.1)
Chloride: 103 mmol/L (ref 97–108)
Creatinine: 0.54 MG/DL — ABNORMAL LOW (ref 0.55–1.02)
GFR est AA: >60 mL/min/{1.73_m2} (ref >60)
GFR est non-AA: >60 mL/min/{1.73_m2} (ref >60)
Globulin: 3.2 g/dL (ref 2.0–4.0)
Glucose: 111 mg/dL — ABNORMAL HIGH (ref 65–100)
Potassium: 4.1 mmol/L (ref 3.5–5.1)
Protein, total: 6.9 g/dL (ref 6.4–8.2)
Sodium: 139 mmol/L (ref 136–145)

## 2019-12-28 LAB — CBC WITH AUTOMATED DIFF
ABS. BASOPHILS: 0 10*3/uL (ref 0.0–0.1)
ABS. EOSINOPHILS: 0.1 10*3/uL (ref 0.0–0.4)
ABS. IMM. GRANS.: 0 10*3/uL (ref 0.00–0.04)
ABS. LYMPHOCYTES: 1.7 10*3/uL (ref 0.8–3.5)
ABS. MONOCYTES: 0.6 10*3/uL (ref 0.0–1.0)
ABS. NEUTROPHILS: 5.1 10*3/uL (ref 1.8–8.0)
ABSOLUTE NRBC: 0 10*3/uL (ref 0.00–0.01)
BASOPHILS: 0 % (ref 0–1)
EOSINOPHILS: 2 % (ref 0–7)
HCT: 39.3 % (ref 35.0–47.0)
HGB: 12.4 g/dL (ref 11.5–16.0)
IMMATURE GRANULOCYTES: 0 % (ref 0.0–0.5)
LYMPHOCYTES: 22 % (ref 12–49)
MCH: 30.4 PG (ref 26.0–34.0)
MCHC: 31.6 g/dL (ref 30.0–36.5)
MCV: 96.3 FL (ref 80.0–99.0)
MONOCYTES: 9 % (ref 5–13)
MPV: 9.5 FL (ref 8.9–12.9)
NEUTROPHILS: 67 % (ref 32–75)
NRBC: 0 PER 100 WBC
PLATELET: 284 10*3/uL (ref 150–400)
RBC: 4.08 M/uL (ref 3.80–5.20)
RDW: 12.2 % (ref 11.5–14.5)
WBC: 7.6 10*3/uL (ref 3.6–11.0)

## 2019-12-28 LAB — EKG, 12 LEAD, INITIAL
Atrial Rate: 56 {beats}/min
Calculated P Axis: 70 degrees
Calculated R Axis: -57 degrees
Calculated T Axis: 42 degrees
P-R Interval: 114 ms
Q-T Interval: 446 ms
QRS Duration: 72 ms
QTC Calculation (Bezet): 430 ms
Ventricular Rate: 56 {beats}/min

## 2019-12-28 LAB — D DIMER: D-dimer: 0.41 mg/L FEU (ref 0.00–0.65)

## 2019-12-28 LAB — TROPONIN I: Troponin-I, Qt.: <0.05 ng/mL (ref <0.05)

## 2019-12-28 LAB — POC TROPONIN-I
POC Troponin I: <0.04 ng/mL (ref 0.00–0.08)
Troponin-I (POC): <0.04 ng/mL (ref 0.00–0.08)

## 2019-12-28 LAB — EKG 12-LEAD
Atrial Rate: 56 {beats}/min
P Axis: 70 degrees
P-R Interval: 114 ms
Q-T Interval: 446 ms
QRS Duration: 72 ms
QTc Calculation (Bazett): 430 ms
R Axis: -57 degrees
T Axis: 42 degrees
Ventricular Rate: 56 {beats}/min

## 2019-12-28 LAB — CBC WITH AUTO DIFFERENTIAL
Basophils %: 0 % (ref 0–1)
Basophils Absolute: 0 10*3/uL (ref 0.0–0.1)
Eosinophils %: 2 % (ref 0–7)
Eosinophils Absolute: 0.1 10*3/uL (ref 0.0–0.4)
Granulocyte Absolute Count: 0 10*3/uL (ref 0.00–0.04)
Hematocrit: 39.3 % (ref 35.0–47.0)
Hemoglobin: 12.4 g/dL (ref 11.5–16.0)
Immature Granulocytes: 0 % (ref 0.0–0.5)
Lymphocytes %: 22 % (ref 12–49)
Lymphocytes Absolute: 1.7 10*3/uL (ref 0.8–3.5)
MCH: 30.4 PG (ref 26.0–34.0)
MCHC: 31.6 g/dL (ref 30.0–36.5)
MCV: 96.3 FL (ref 80.0–99.0)
MPV: 9.5 FL (ref 8.9–12.9)
Monocytes %: 9 % (ref 5–13)
Monocytes Absolute: 0.6 10*3/uL (ref 0.0–1.0)
NRBC Absolute: 0 10*3/uL (ref 0.00–0.01)
Neutrophils %: 67 % (ref 32–75)
Neutrophils Absolute: 5.1 10*3/uL (ref 1.8–8.0)
Nucleated RBCs: 0 PER 100 WBC
Platelets: 284 10*3/uL (ref 150–400)
RBC: 4.08 M/uL (ref 3.80–5.20)
RDW: 12.2 % (ref 11.5–14.5)
WBC: 7.6 10*3/uL (ref 3.6–11.0)

## 2019-12-28 LAB — COMPREHENSIVE METABOLIC PANEL
ALT: 22 U/L (ref 12–78)
AST: 14 U/L — ABNORMAL LOW (ref 15–37)
Albumin/Globulin Ratio: 1.2 (ref 1.1–2.2)
Albumin: 3.7 g/dL (ref 3.5–5.0)
Alkaline Phosphatase: 63 U/L (ref 45–117)
Anion Gap: 1 mmol/L — ABNORMAL LOW (ref 5–15)
BUN: 10 MG/DL (ref 6–20)
Bun/Cre Ratio: 19 (ref 12–20)
CO2: 35 mmol/L — ABNORMAL HIGH (ref 21–32)
Calcium: 9 MG/DL (ref 8.5–10.1)
Chloride: 103 mmol/L (ref 97–108)
Creatinine: 0.54 MG/DL — ABNORMAL LOW (ref 0.55–1.02)
EGFR IF NonAfrican American: >60 mL/min/{1.73_m2} (ref >60)
GFR African American: >60 mL/min/{1.73_m2} (ref >60)
Globulin: 3.2 g/dL (ref 2.0–4.0)
Glucose: 111 mg/dL — ABNORMAL HIGH (ref 65–100)
Potassium: 4.1 mmol/L (ref 3.5–5.1)
Sodium: 139 mmol/L (ref 136–145)
Total Bilirubin: 0.2 MG/DL (ref 0.2–1.0)
Total Protein: 6.9 g/dL (ref 6.4–8.2)

## 2019-12-28 LAB — D-DIMER, QUANTITATIVE: D-Dimer, Quant: 0.41 mg/L FEU (ref 0.00–0.65)

## 2019-12-28 LAB — TROPONIN: Troponin I: <0.05 ng/mL (ref <0.05)

## 2019-12-28 NOTE — ED Provider Notes (Signed)
ED Provider Notes by Uvaldo Bristle, MD at 12/28/19 2052                Author: Uvaldo Bristle, MD  Service: Emergency Medicine  Author Type: Physician       Filed: 01/02/20 1525  Date of Service: 12/28/19 2052  Status: Signed          Editor: Uvaldo Bristle, MD (Physician)               EMERGENCY DEPARTMENT HISTORY AND PHYSICAL EXAM           Date: 12/28/2019   Patient Name: Claudia [name]        History of Presenting Illness          Chief Complaint       Patient presents with        ?  Chest Pain             x 2 hours ago.            History Provided By: Patient      HPI: Claudia [name],  64 y.o. female with a past medical history significant for CAD, COPD, history of MI, medical  problems as stated below presents to the ED with cc of moderate substernal chest pain that began approximately 2 hours before arrival.  The pain is located in patient's chest and does not radiate to her neck, arms, or back.  She reported some increase  shortness of breath, wheezing, nonproductive cough.  She denies any lower extremity swelling, no pleuritic component, no exertional component.  No associated diaphoresis or nausea or vomiting.  No other associated symptoms.  No other exacerbating or mounting  factors.  She does know pain only lasts a few seconds and then resolve spontaneously.      There are no other complaints, changes, or physical findings at this time.      PCP: Marisa Severin, DO        No current facility-administered medications on file prior to encounter.          Current Outpatient Medications on File Prior to Encounter          Medication  Sig  Dispense  Refill           ?  lidocaine (LIDODERM) 5 %  Apply patch to the affected area for 12 hours a day and remove for 12 hours a day.  5 Each  0     ?  traZODone (DESYREL) 100 mg tablet  Take 100 mg by mouth nightly.         ?  loratadine (CLARITIN) 10 mg tablet  Take 10 mg by mouth daily.         ?  nicotine polacrilex 4 mg lozenge   4 mg by Buccal route as needed for Smoking Cessation.               ?  promethazine-dextromethorphan (PROMETHAZINE-DM) 6.25-15 mg/5 mL syrup  Take 5 mL by mouth every four (4) hours as needed for Cough.               ?  ezetimibe (ZETIA) 10 mg tablet  Take 5 mg by mouth daily.         ?  albuterol (PROVENTIL VENTOLIN) 2.5 mg /3 mL (0.083 %) nebulizer solution  2.5 mg by Nebulization route every six (6) hours.         ?  docusate  sodium (COLACE) 100 mg capsule  Take 1 Cap by mouth two (2) times a day.  30 Cap  0     ?  tiotropium (SPIRIVA WITH HANDIHALER) 18 mcg inhalation capsule  Take 1 Cap by inhalation daily.         ?  atorvastatin (LIPITOR) 40 mg tablet  Take  by mouth every morning. Indications: HYPERCHOLESTEROLEMIA         ?  cholecalciferol (VITAMIN D3) 1,000 unit tablet  Take  by mouth daily.         ?  aspirin 81 mg tablet  Take 81 mg by mouth daily.         ?  citalopram (CELEXA) 40 mg tablet  Take 40 mg by mouth every morning.         ?  benztropine (COGENTIN) 0.5 mg tablet  Take 0.5 mg by mouth nightly.               ?  olanzapine (ZYPREXA) 15 mg tablet  Take 15 mg by mouth nightly. Indications: SCHIZOPHRENIA                 Past History        Past Medical History:     Past Medical History:        Diagnosis  Date         ?  Arthritis            unknown type - back         ?  CAD (coronary artery disease)             stent placed in 2006 [place]; followed by Dr Henriette Combs         ?  Cancer [facility])            colon ca         ?  Chronic obstructive pulmonary disease ([place])            managed by PCP         ?  Hypercholesteremia       ?  Menopause       ?  MI (myocardial infarction) ([place])  2006          in [place]         ?  Psychiatric disorder            schizophrenia         ?  Schizophrenia ([place])            Dr Beverlee Nims on Lauralyn Primes           Past Surgical History:     Past Surgical History:         Procedure  Laterality  Date          ?  COLONOSCOPY  N/A  08/04/2017          COLONOSCOPY performed by  Eugenie Filler., MD at [facility]          ?  COLONOSCOPY,REMV LESN,SNARE    08/04/2017                     ?  COLORECTAL SCRN; HI RISK IND    11/01/2013                     ?  COLORECTAL SCRN; HI RISK IND    08/04/2017                     ?  HX HEENT              teeth removal          ?  PR CARDIAC SURG PROCEDURE UNLIST    2006          stent placed           Family History:     Family History         Problem  Relation  Age of Onset          ?  Heart Attack  Father       ?  Cancer  Mother                breast          ?  Breast Cancer  Mother                76's          ?  Cancer  Brother                kidney           Social History:     Social History          Tobacco Use         ?  Smoking status:  Former Smoker              Packs/day:  1.50         Years:  42.00         Pack years:  63.00         ?  Smokeless tobacco:  Current User        ?  Tobacco comment: vaping       Substance Use Topics         ?  Alcohol use:  Not Currently         ?  Drug use:  No           Allergies:   No Known Allergies           Review of Systems     Review of Systems    Constitutional: Negative for chills, diaphoresis, fatigue and fever.    HENT: Negative for ear pain and sore throat.     Eyes: Negative for pain and redness.    Respiratory: Positive for cough and shortness of breath .     Cardiovascular: Positive for chest pain. Negative for leg swelling.    Gastrointestinal: Negative for abdominal pain, diarrhea, nausea and vomiting.    Endocrine: Negative for cold intolerance and heat intolerance.    Genitourinary: Negative for flank pain and hematuria.    Musculoskeletal: Negative for back pain and neck stiffness.    Skin: Negative for rash and wound.    Neurological: Negative for dizziness, syncope and headaches.    All other systems reviewed and are negative.           Physical Exam     Physical Exam   Vitals and nursing note reviewed.   Constitutional:        General: She is not in acute distress.      Appearance: She is well-developed. She is not ill-appearing.    HENT:       Head: Normocephalic and atraumatic.      Mouth/Throat:      Pharynx: No oropharyngeal exudate.    Eyes:       Conjunctiva/sclera: Conjunctivae normal.  Pupils: Pupils are equal, round, and reactive to light.   Cardiovascular:       Rate and Rhythm: Normal rate and regular rhythm.      Heart sounds: No murmur heard.      Pulmonary:       Effort: Pulmonary effort is normal. No tachypnea, accessory muscle usage or respiratory distress.      Breath sounds: No stridor. Examination of the right-middle field reveals wheezing . Examination of the left-middle field reveals wheezing. Examination of the right-lower field reveals  wheezing. Examination of the left-lower field reveals wheezing.  Wheezing present. No decreased breath sounds or rales.   Abdominal:      General:  Bowel sounds are normal. There is no distension.      Palpations: Abdomen is soft.      Tenderness: There is no abdominal tenderness.     Musculoskeletal:          General: No deformity. Normal range of motion.      Cervical back: Normal range of motion.    Skin:      General: Skin is warm and dry.      Findings: No rash.    Neurological:       Mental Status: She is alert and oriented to person, place, and time.      Coordination: Coordination normal.    Psychiatric:         Behavior: Behavior normal.               Diagnostic Study Results        Labs -    No results found for this or any previous visit (from the past 24 hour(s)).     Recent Results (from the past 168 hour(s))     EKG, 12 LEAD, INITIAL          Collection Time: 12/28/19  4:14 PM         Result  Value  Ref Range            Ventricular Rate  56  BPM       Atrial Rate  56  BPM       P-R Interval  114  ms       QRS Duration  72  ms       Q-T Interval  446  ms       QTC Calculation (Bezet)  430  ms       Calculated P Axis  70  degrees       Calculated R Axis  -57  degrees       Calculated T Axis  42  degrees        Diagnosis                 Sinus bradycardia   Left axis deviation   Poor R-wave Progression (consider lead placement or loss of anterior forces)   When compared with ECG of 03-Jul-2018 01:14,   No significant change was found   Confirmed by Koren Bound MD, Iona Beard [phone]) on 12/28/2019 4:33:27 PM          CBC WITH AUTOMATED DIFF          Collection Time: 12/28/19  4:32 PM         Result  Value  Ref Range            WBC  7.6  3.6 - 11.0 K/uL       RBC  4.08  3.80 -  5.20 M/uL       HGB  12.4  11.5 - 16.0 g/dL       HCT  39.3  35.0 - 47.0 %       MCV  96.3  80.0 - 99.0 FL       MCH  30.4  26.0 - 34.0 PG       MCHC  31.6  30.0 - 36.5 g/dL       RDW  12.2  11.5 - 14.5 %       PLATELET  284  150 - 400 K/uL       MPV  9.5  8.9 - 12.9 FL       NRBC  0.0  0 PER 100 WBC       ABSOLUTE NRBC  0.00  0.00 - 0.01 K/uL       NEUTROPHILS  67  32 - 75 %       LYMPHOCYTES  22  12 - 49 %       MONOCYTES  9  5 - 13 %       EOSINOPHILS  2  0 - 7 %       BASOPHILS  0  0 - 1 %       IMMATURE GRANULOCYTES  0  0.0 - 0.5 %       ABS. NEUTROPHILS  5.1  1.8 - 8.0 K/UL       ABS. LYMPHOCYTES  1.7  0.8 - 3.5 K/UL       ABS. MONOCYTES  0.6  0.0 - 1.0 K/UL       ABS. EOSINOPHILS  0.1  0.0 - 0.4 K/UL       ABS. BASOPHILS  0.0  0.0 - 0.1 K/UL       ABS. IMM. GRANS.  0.0  0.00 - 0.04 K/UL       DF  AUTOMATED          METABOLIC PANEL, COMPREHENSIVE          Collection Time: 12/28/19  4:32 PM         Result  Value  Ref Range            Sodium  139  136 - 145 mmol/L       Potassium  4.1  3.5 - 5.1 mmol/L       Chloride  103  97 - 108 mmol/L       CO2  35 (H)  21 - 32 mmol/L       Anion gap  1 (L)  5 - 15 mmol/L       Glucose  111 (H)  65 - 100 mg/dL       BUN  10  6 - 20 MG/DL       Creatinine  0.54 (L)  0.55 - 1.02 MG/DL       BUN/Creatinine ratio  19  12 - 20         GFR est AA  >60  >60 ml/min/1.7m       GFR est non-AA  >60  >60 ml/min/1.772m      Calcium  9.0  8.5 - 10.1 MG/DL       Bilirubin, total  0.2  0.2 - 1.0 MG/DL       ALT (SGPT)  22  12 - 78  U/L       AST (SGOT)  14 (L)  15 - 37 U/L  Alk. phosphatase  63  45 - 117 U/L       Protein, total  6.9  6.4 - 8.2 g/dL       Albumin  3.7  3.5 - 5.0 g/dL       Globulin  3.2  2.0 - 4.0 g/dL       A-G Ratio  1.2  1.1 - 2.2         TROPONIN I          Collection Time: 12/28/19  4:32 PM         Result  Value  Ref Range            Troponin-I, Qt.  <0.05  <0.05 ng/mL       D DIMER          Collection Time: 12/28/19  5:28 PM         Result  Value  Ref Range            D-dimer  0.41  0.00 - 0.65 mg/L FEU       EKG, 12 LEAD, INITIAL          Collection Time: 12/28/19  6:53 PM         Result  Value  Ref Range            Ventricular Rate  54  BPM       Atrial Rate  54  BPM       P-R Interval  106  ms       QRS Duration  78  ms       Q-T Interval  448  ms       QTC Calculation (Bezet)  424  ms       Calculated P Axis  63  degrees       Calculated R Axis  -53  degrees       Calculated T Axis  53  degrees       Diagnosis                 Sinus bradycardia with short PR   Left axis deviation   Nonspecific ST abnormality      When compared with ECG of 28-Dec-2019 16:14,   No significant change was found   Confirmed by Comer Locket [phone]) on 12/29/2019 12:25:47 PM          POC TROPONIN-I          Collection Time: 12/28/19  6:58 PM         Result  Value  Ref Range            Troponin-I (POC)  <0.04  0.00 - 0.08 ng/mL           Radiologic Studies -      No orders to display          CT Results   (Last 48 hours)          None                 CXR Results   (Last 48 hours)          None                       Medical Decision Making     I am the first provider for this patient.      I reviewed the vital signs, available nursing notes, past medical history, past surgical  history, family history and social history.      Vital Signs-Reviewed the patient's vital signs.   No data found.     Vitals:             12/28/19 1815  12/28/19 1830  12/28/19 1845  12/28/19 1900           BP:  (!) 118/57  (!) 127/50  (!) 123/47  (!) 119/93      Pulse:  (!) 54  (!) 50  (!) 57  (!) 59     Resp:  22  14  13  20      Temp:             SpO2:  93%  92%  91%  92%     Weight:                   Height:                   Records Reviewed: Nursing records and medical records reviewed      MDM:   Patient presents with CP.  DDx:  ACS, Aortic dissection, PNA, PE, PTX, pericarditis, myocarditis, GERD, costochondritis, anxiety.           Provider Notes (Medical Decision Making):    Patient is a 64 year old female presenting with highly atypical chest pain for acute coronary syndrome with normal EKG and normal troponins after serial measurements.  Patient's chronic wheezing appears  to not demonstrate any findings that would suggest pneumonia.  Therefore, chest x-ray was deferred.  She does seem to be having some mild degree of COPD exacerbation and therefore we will treat with course of antibiotics, steroids, and she will continue  her inhalers at home.  I do not think her few seconds of chest pain warrants a full cardiac work-up at this time but if she worsens in any way she will come back to the ER for repeat evaluation.      ED Course:    Initial assessment performed. The patients presenting problems have been discussed, and they are in agreement with the care plan formulated and outlined with them.  I have encouraged them to ask questions as they arise throughout their visit.               Medications Administered           acetaminophen (TYLENOL) tablet 1,000 mg         Admin Date   12/28/2019  Action   Given  Dose   1,000 mg  Route   Oral  Administered By   Elly Modena, RN                         predniSONE (DELTASONE) tablet 60 mg         Admin Date   12/28/2019  Action   Given  Dose   60 mg  Route   Oral  Administered By   Elly Modena, RN                                       Critical Care:   None         Disposition:   3:25 PM   Trivia A Krone's  results have been reviewed with her .  She has been counseled regarding her  diagnosis.  She verbally conveys  understanding and agreement of the signs, symptoms, diagnosis, treatment and prognosis and additionally agrees  to follow up as recommended with Dr. Maryagnes Amos, Drema Pry, DO in 24 - 48 hours.  She  also agrees with the care-plan and conveys that all of her questions have been answered.  I have also put together some discharge instructions  for her that include: 1) educational information regarding their diagnosis, 2) how to care for their diagnosis at home, as well a 3) list of  reasons why they would want to return to the ED prior to their follow-up appointment, should their condition change.                     DISCHARGE PLAN:   1.      Discharge Medication List as of 12/28/2019  8:30 PM              CONTINUE these medications which have CHANGED          Details        doxycycline (VIBRA-TABS) 100 mg tablet  Take 1 Tablet by mouth two (2) times a day for 7 days., Normal, Disp-14 Tablet, R-0               predniSONE (DELTASONE) 20 mg tablet  Take 60 mg by mouth daily for 5 days., Normal, Disp-15 Tablet, R-0                     CONTINUE these medications which have NOT CHANGED          Details        lidocaine (LIDODERM) 5 %  Apply patch to the affected area for 12 hours a day and remove for 12 hours a day., Print, Disp-5 Each, R-0               traZODone (DESYREL) 100 mg tablet  Take 100 mg by mouth nightly., Historical Med               loratadine (CLARITIN) 10 mg tablet  Take 10 mg by mouth daily., Historical Med               nicotine polacrilex 4 mg lozenge  4 mg by Buccal route as needed for Smoking Cessation., Historical Med               promethazine-dextromethorphan (PROMETHAZINE-DM) 6.25-15 mg/5 mL syrup  Take 5 mL by mouth every four (4) hours as needed for Cough., Historical Med               ezetimibe (ZETIA) 10 mg tablet  Take 5 mg by mouth daily., Historical Med               albuterol (PROVENTIL VENTOLIN) 2.5 mg /3 mL (0.083 %) nebulizer solution  2.5 mg by Nebulization route every six (6) hours.,  Historical Med               docusate sodium (COLACE) 100 mg capsule  Take 1 Cap by mouth two (2) times a day., Print, Disp-30 Cap, R-0               tiotropium (SPIRIVA WITH HANDIHALER) 18 mcg inhalation capsule  Take 1 Cap by inhalation daily., Historical Med               atorvastatin (LIPITOR) 40 mg tablet  Take  by mouth every morning. Indications: HYPERCHOLESTEROLEMIA, Historical Med  cholecalciferol (VITAMIN D3) 1,000 unit tablet  Take  by mouth daily., Historical Med               aspirin 81 mg tablet  Take 81 mg by mouth daily., Historical Med               citalopram (CELEXA) 40 mg tablet  Take 40 mg by mouth every morning., Historical Med               benztropine (COGENTIN) 0.5 mg tablet  Take 0.5 mg by mouth nightly., Historical Med               olanzapine (ZYPREXA) 15 mg tablet  Take 15 mg by mouth nightly. Indications: SCHIZOPHRENIA, Historical Med                      2.      Follow-up Information               Follow up With  Specialties  Details  Why  Contact Info              Biggers, Drema Pry, DO  Family Medicine  In 3 days  For a follow-up evaluation.  [place] [address]   [phone]                 Louie Boston, MD  Pulmonary Disease  In 1 week  For a follow-up evaluation.  [facility]   [address]   [place] [address]   [phone]                 Tobey Grim, MD  Cardiology  In 1 week  For a follow-up evaluation of your chest pain.  [address]   [address]   [place] [address]   [phone]                3.  Return to ED if worse         Diagnosis        Clinical Impression:       1.  Acute chest pain         2.  COPD exacerbation ([place])            Attestations:      Uvaldo Bristle, MD      Please note that this dictation was completed with Dragon, the computer voice recognition software.  Quite often unanticipated grammatical, syntax, homophones, and other interpretive errors are   inadvertently transcribed by the computer software.  Please disregard these errors.  Please excuse any errors that have escaped final proofreading.  Thank you.

## 2019-12-28 NOTE — ED Notes (Signed)
Bedside and Verbal shift change report given to [place] (oncoming nurse) by Shirlee Limerick RN (offgoing nurse). Report included the following information SBAR, ED Summary, Intake/Output, MAR and Recent Results.

## 2019-12-28 NOTE — ED Notes (Signed)
Pt arrives to ED via EMS with c/o left sided chest pain that started at approximately 1400. Pt states the pain is worse when she moves around. Pt denies any acute sob but has a hx of COPD and wears 2L NC at night. She also has a hx of a MI 15 years ago with stents placed. Pt is A&Ox4 and on cardiac monitor x3 at this time.

## 2019-12-28 NOTE — ED Notes (Signed)
Ashley RN reviewed discharge instructions with the patient.  The patient verbalized understanding.  All questions and concerns were addressed.  The patient is discharged via wheelchair in the care of family members with instructions and prescriptions in hand.  Pt is alert and oriented x 4.  Respirations are clear and unlabored.

## 2019-12-29 LAB — EKG, 12 LEAD, INITIAL
Atrial Rate: 54 {beats}/min
Calculated P Axis: 63 degrees
Calculated R Axis: -53 degrees
Calculated T Axis: 53 degrees
P-R Interval: 106 ms
Q-T Interval: 448 ms
QRS Duration: 78 ms
QTC Calculation (Bezet): 424 ms
Ventricular Rate: 54 {beats}/min

## 2019-12-29 LAB — EKG 12-LEAD
Atrial Rate: 54 {beats}/min
P Axis: 63 degrees
P-R Interval: 106 ms
Q-T Interval: 448 ms
QRS Duration: 78 ms
QTc Calculation (Bazett): 424 ms
R Axis: -53 degrees
T Axis: 53 degrees
Ventricular Rate: 54 {beats}/min

## 2019-12-29 MED ORDER — DOXYCYCLINE HYCLATE 100 MG TAB
100 mg | ORAL_TABLET | Freq: Two times a day (BID) | ORAL | 0 refills | Status: DC
Start: 2019-12-29 — End: 2019-12-28

## 2019-12-29 MED ORDER — PREDNISONE 20 MG TAB
20 mg | ORAL_TABLET | Freq: Every day | ORAL | 0 refills | Status: AC
Start: 2019-12-29 — End: 2020-01-02

## 2019-12-29 MED ORDER — ACETAMINOPHEN 500 MG TAB
500 mg | Freq: Once | ORAL | Status: AC
Start: 2019-12-29 — End: 2019-12-28
  Administered 2019-12-29: 01:00:00 via ORAL

## 2019-12-29 MED ORDER — PREDNISONE 20 MG TAB
20 mg | ORAL_TABLET | Freq: Every day | ORAL | 0 refills | Status: DC
Start: 2019-12-29 — End: 2019-12-28

## 2019-12-29 MED ORDER — PREDNISONE 20 MG TAB
20 mg | ORAL | Status: AC
Start: 2019-12-29 — End: 2019-12-28
  Administered 2019-12-29: 01:00:00 via ORAL

## 2019-12-29 MED ORDER — DOXYCYCLINE HYCLATE 100 MG TAB
100 mg | ORAL_TABLET | Freq: Two times a day (BID) | ORAL | 0 refills | Status: AC
Start: 2019-12-29 — End: 2020-01-04

## 2019-12-29 MED FILL — ACETAMINOPHEN 500 MG TAB: 500 mg | ORAL | Qty: 2

## 2019-12-29 MED FILL — PREDNISONE 20 MG TAB: 20 mg | ORAL | Qty: 3

## 2020-03-27 ENCOUNTER — Ambulatory Visit: Payer: MEDICARE | Primary: Family Medicine

## 2020-04-19 IMAGING — MR Knee^Routine
1 series · 1 of 1 positions shown · non-contrast
Comparison: none

[Series 3: T2 fat-sat · axial · 4.0mm · 0.50mm/px · 1 of 1 slices shown]
[im 1/1]
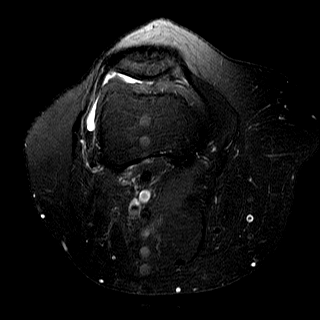

[1 of 1 positions shown; findings below may reference images not displayed]

EXAM

MR knee RT wo con

INDICATION

Right posterior knee pain

TECHNIQUE

MRI of the knee was performed. Sequences include coronal, sagittal, and axial planes with fast spin
echo technique, both in fat saturated and non-fat saturated. No intravenous contrast was
administered.

COMPARISONS

XR right knee 04/11/2020

FINDINGS

MEDIAL COMPARTMENT: Predominately inner [DATE] complex tear of the body of the medial meniscus with
mild medial meniscal extrusion and volume loss (series 5, image 16). Cartilage fibrillation and
partial-thickness cartilage loss of the central weight-bearing medial compartment. Small amount of
subchondral edema at the medial lip of the medial femoral condyle. Trace amount of incompletely
characterized cartilage fibrillation at the posterior medial femoral condyle.

LATERAL COMPARTMENT: Possible longitudinal vertical type tear of the anterior horn of the lateral
meniscus (series 6, image 7). Volume loss of the body of the lateral meniscus. Near full-thickness
focal cartilage ulceration of the posterior nonweightbearing lateral femoral condyle. Small amount
of subchondral edema at the medial aspect of the posterior lateral tibial plateau with broad-based
full-thickness cartilage loss of the lateral tibial plateau.

PATELLOFEMORAL COMPARTMENT: Near full-thickness broad based cartilage loss of the medial patellar
facet extending over the median ridge.

CRUCIATE LIGAMENTS: Intact.

MEDIAL SUPPORTING STRUCTURES: Intact superficial and deep medial collateral ligaments.

LATERAL SUPPORTING STRUCTURES: Intact iliotibial band, lateral capsular ligament, lateral
collateral ligament, biceps femoris tendon, and popliteus tendon.

EXTENSOR MECHANISM: Intact.

JOINT SPACE/FLUID: Small knee joint effusion. No Baker's cyst. Small amount of fluid along the
popliteus tendon sheath.

BONES: No acute fracture, osseous contusion, or aggressive focal osseous lesion. Marginal
osteophytosis in all 3 compartments.

MUSCLES: Normal signal intensity and morphology.

NEUROVASCULAR: Normal popliteal neurovascular bundle.

IMPRESSION
1. Tricompartmental osteoarthrosis with associated cartilage loss and joint space narrowing as
detailed above.
2. Inner [DATE] complex tear of the body of the medial meniscus.
3. Possible longitudinal vertical type tear of the anterior horn of the lateral meniscus.
4. Small knee joint effusion.

Tech Notes:

RT POSTERIOR KNEE PAIN WITH SPASMS TO LEG.  NO KNOWN INJURY.  RG

## 2020-04-20 ENCOUNTER — Inpatient Hospital Stay: Payer: MEDICAID | Attending: Family Medicine | Primary: Family Medicine

## 2020-04-20 ENCOUNTER — Ambulatory Visit: Payer: MEDICAID | Primary: Family Medicine

## 2020-05-07 ENCOUNTER — Encounter: Admit: 2020-05-07 | Discharge: 2020-05-07 | Payer: MEDICARE

## 2020-06-01 ENCOUNTER — Encounter

## 2020-06-04 ENCOUNTER — Inpatient Hospital Stay: Admit: 2020-06-04 | Payer: MEDICARE | Attending: Family Medicine | Primary: Family Medicine

## 2020-06-04 DIAGNOSIS — Z1231 Encounter for screening mammogram for malignant neoplasm of breast: Secondary | ICD-10-CM

## 2020-06-22 ENCOUNTER — Inpatient Hospital Stay: Admit: 2020-06-22 | Payer: MEDICAID | Attending: Acute Care | Primary: Family Medicine

## 2020-06-22 DIAGNOSIS — Z122 Encounter for screening for malignant neoplasm of respiratory organs: Secondary | ICD-10-CM

## 2020-07-04 ENCOUNTER — Ambulatory Visit: Admit: 2020-07-04 | Discharge: 2020-07-05 | Payer: MEDICARE

## 2020-07-04 ENCOUNTER — Encounter: Admit: 2020-07-04 | Discharge: 2020-07-04 | Payer: MEDICARE

## 2020-07-04 DIAGNOSIS — R0789 Other chest pain: Secondary | ICD-10-CM

## 2020-07-05 ENCOUNTER — Encounter: Admit: 2020-07-05 | Discharge: 2020-07-05 | Payer: MEDICARE

## 2020-07-05 DIAGNOSIS — R0600 Dyspnea, unspecified: Secondary | ICD-10-CM

## 2020-07-05 DIAGNOSIS — R002 Palpitations: Secondary | ICD-10-CM

## 2020-07-05 DIAGNOSIS — R0789 Other chest pain: Secondary | ICD-10-CM

## 2020-07-05 LAB — COMPREHENSIVE METABOLIC PANEL
Lab: 0.3
Lab: 0.9
Lab: 103
Lab: 18 — AB (ref 11.5–14.5)
Lab: 25
Lab: 29
Lab: 3.6
Lab: 8.1
Lab: 8.9
Lab: 81
Lab: 93

## 2020-07-05 LAB — CBC
Lab: 11 MMOL/L — AB (ref 12–15.5)
Lab: 28 mL/min (ref >60)
Lab: 33
Lab: 35 U/L — AB (ref 36–47)
Lab: 4 U/L (ref 7–40)

## 2020-07-06 ENCOUNTER — Encounter: Admit: 2020-07-06 | Discharge: 2020-07-06 | Payer: MEDICARE

## 2020-07-06 DIAGNOSIS — R002 Palpitations: Secondary | ICD-10-CM

## 2020-07-06 DIAGNOSIS — R0789 Other chest pain: Secondary | ICD-10-CM

## 2020-07-06 DIAGNOSIS — R0600 Dyspnea, unspecified: Secondary | ICD-10-CM

## 2020-07-06 LAB — THYROID STIMULATING HORMONE-TSH: Lab: 1.6

## 2020-08-22 ENCOUNTER — Encounter: Attending: Neurology | Primary: Family Medicine

## 2020-09-13 ENCOUNTER — Encounter: Admit: 2020-09-13 | Discharge: 2020-09-13 | Payer: MEDICARE

## 2020-10-03 ENCOUNTER — Ambulatory Visit: Admit: 2020-10-03 | Discharge: 2020-10-03 | Payer: MEDICARE

## 2020-10-03 ENCOUNTER — Encounter: Admit: 2020-10-03 | Discharge: 2020-10-03 | Payer: MEDICARE

## 2020-10-03 DIAGNOSIS — R002 Palpitations: Secondary | ICD-10-CM

## 2020-10-03 DIAGNOSIS — R0600 Dyspnea, unspecified: Secondary | ICD-10-CM

## 2020-10-03 DIAGNOSIS — R0789 Other chest pain: Secondary | ICD-10-CM

## 2020-10-03 MED ORDER — PERFLUTREN LIPID MICROSPHERES 1.1 MG/ML IV SUSP
1-10 mL | Freq: Once | INTRAVENOUS | 0 refills | Status: CP | PRN
Start: 2020-10-03 — End: ?
  Administered 2020-10-03: 21:00:00 6 mL via INTRAVENOUS

## 2020-10-16 ENCOUNTER — Encounter: Admit: 2020-10-16 | Discharge: 2020-10-16 | Payer: MEDICARE

## 2020-11-20 ENCOUNTER — Encounter: Attending: Neurology | Primary: Family Medicine

## 2021-03-06 ENCOUNTER — Encounter: Attending: Neurology | Primary: Family Medicine

## 2021-03-18 ENCOUNTER — Inpatient Hospital Stay
Admit: 2021-03-18 | Discharge: 2021-03-18 | Disposition: A | Discharge status: Home / Self Care | Payer: MEDICARE | Attending: Emergency Medicine

## 2021-03-18 DIAGNOSIS — U071 COVID-19: Secondary | ICD-10-CM

## 2021-03-18 LAB — EKG, 12 LEAD, INITIAL
Atrial Rate: 81 {beats}/min
Calculated P Axis: 86 degrees
Calculated R Axis: -65 degrees
Calculated T Axis: 65 degrees
Diagnosis: NORMAL
P-R Interval: 154 ms
Q-T Interval: 374 ms
QRS Duration: 82 ms
QTC Calculation (Bezet): 434 ms
Ventricular Rate: 81 {beats}/min

## 2021-03-18 LAB — URINALYSIS W/ REFLEX CULTURE
BACTERIA, URINE: NEGATIVE /hpf
Bacteria: NEGATIVE /hpf
Bilirubin, Urine: NEGATIVE
Bilirubin: NEGATIVE
Blood, Urine: NEGATIVE
Blood: NEGATIVE
Glucose, Ur: NEGATIVE mg/dL
Glucose: NEGATIVE mg/dL
Ketone: NEGATIVE mg/dL
Ketones, Urine: NEGATIVE mg/dL
Nitrite, Urine: NEGATIVE
Nitrites: NEGATIVE
Protein, UA: NEGATIVE mg/dL
Protein: NEGATIVE mg/dL
Specific Gravity, UA: 1.011
Specific gravity: 1.011
Urobilinogen, UA, POCT: 0.2 EU/dL (ref 0.2–1.0)
Urobilinogen: 0.2 EU/dL (ref 0.2–1.0)
pH (UA): 5.5 (ref 5.0–8.0)
pH, UA: 5.5 (ref 5.0–8.0)

## 2021-03-18 LAB — CBC WITH AUTOMATED DIFF
ABS. BASOPHILS: 0 10*3/uL (ref 0.0–0.1)
ABS. EOSINOPHILS: 0.1 10*3/uL (ref 0.0–0.4)
ABS. IMM. GRANS.: 0.1 10*3/uL — ABNORMAL HIGH (ref 0.00–0.04)
ABS. LYMPHOCYTES: 0.1 10*3/uL — ABNORMAL LOW (ref 0.8–3.5)
ABS. MONOCYTES: 0.6 10*3/uL (ref 0.0–1.0)
ABS. NEUTROPHILS: 6.3 10*3/uL (ref 1.8–8.0)
ABSOLUTE NRBC: 0 10*3/uL (ref 0.00–0.01)
BASOPHILS: 0 % (ref 0–1)
EOSINOPHILS: 1 % (ref 0–7)
HCT: 34.8 % — ABNORMAL LOW (ref 35.0–47.0)
HGB: 11.7 g/dL (ref 11.5–16.0)
IMMATURE GRANULOCYTES: 1 % — ABNORMAL HIGH (ref 0.0–0.5)
LYMPHOCYTES: 2 % — ABNORMAL LOW (ref 12–49)
MCH: 30.5 PG (ref 26.0–34.0)
MCHC: 33.6 g/dL (ref 30.0–36.5)
MCV: 90.9 FL (ref 80.0–99.0)
MONOCYTES: 8 % (ref 5–13)
MPV: 9.2 FL (ref 8.9–12.9)
NEUTROPHILS: 88 % — ABNORMAL HIGH (ref 32–75)
NRBC: 0 PER 100 WBC
PLATELET: 188 10*3/uL (ref 150–400)
RBC: 3.83 M/uL (ref 3.80–5.20)
RDW: 12.1 % (ref 11.5–14.5)
WBC: 7.2 10*3/uL (ref 3.6–11.0)

## 2021-03-18 LAB — MAGNESIUM
Magnesium: 1.7 mg/dL (ref 1.6–2.4)
Magnesium: 1.7 mg/dL (ref 1.6–2.4)

## 2021-03-18 LAB — METABOLIC PANEL, COMPREHENSIVE
A-G Ratio: 1.3 (ref 1.1–2.2)
ALT (SGPT): 21 U/L (ref 12–78)
AST (SGOT): 21 U/L (ref 15–37)
Albumin: 3.7 g/dL (ref 3.5–5.0)
Alk. phosphatase: 54 U/L (ref 45–117)
Anion gap: 8 mmol/L (ref 5–15)
BUN/Creatinine ratio: 14 (ref 12–20)
BUN: 11 MG/DL (ref 6–20)
Bilirubin, total: 0.5 MG/DL (ref 0.2–1.0)
CO2: 30 mmol/L (ref 21–32)
Calcium: 9.4 MG/DL (ref 8.5–10.1)
Chloride: 100 mmol/L (ref 97–108)
Creatinine: 0.8 MG/DL (ref 0.55–1.02)
Globulin: 2.9 g/dL (ref 2.0–4.0)
Glucose: 92 mg/dL (ref 65–100)
Potassium: 4 mmol/L (ref 3.5–5.1)
Protein, total: 6.6 g/dL (ref 6.4–8.2)
Sodium: 138 mmol/L (ref 136–145)
eGFR: >60 mL/min/{1.73_m2} (ref >60)

## 2021-03-18 LAB — TROPONIN-HIGH SENSITIVITY
Troponin-High Sensitivity: 57 ng/L — ABNORMAL HIGH (ref 0–51)
Troponin-High Sensitivity: 55 ng/L — ABNORMAL HIGH (ref 0–51)

## 2021-03-18 LAB — INFLUENZA A+B VIRAL AGS
Flu A Antigen: NEGATIVE
Influenza A Antigen: NEGATIVE
Influenza B Antigen: NEGATIVE
Influenza B Antigen: NEGATIVE

## 2021-03-18 LAB — COVID-19 RAPID TEST: COVID-19 rapid test: DETECTED — AB

## 2021-03-18 LAB — COMPREHENSIVE METABOLIC PANEL
ALT: 21 U/L (ref 12–78)
AST: 21 U/L (ref 15–37)
Albumin/Globulin Ratio: 1.3 (ref 1.1–2.2)
Albumin: 3.7 g/dL (ref 3.5–5.0)
Alkaline Phosphatase: 54 U/L (ref 45–117)
Anion Gap: 8 mmol/L (ref 5–15)
BUN: 11 MG/DL (ref 6–20)
Bun/Cre Ratio: 14 (ref 12–20)
CO2: 30 mmol/L (ref 21–32)
Calcium: 9.4 MG/DL (ref 8.5–10.1)
Chloride: 100 mmol/L (ref 97–108)
Creatinine: 0.8 MG/DL (ref 0.55–1.02)
ESTIMATED GLOMERULAR FILTRATION RATE: >60 mL/min/{1.73_m2} (ref >60)
Globulin: 2.9 g/dL (ref 2.0–4.0)
Glucose: 92 mg/dL (ref 65–100)
Potassium: 4 mmol/L (ref 3.5–5.1)
Sodium: 138 mmol/L (ref 136–145)
Total Bilirubin: 0.5 MG/DL (ref 0.2–1.0)
Total Protein: 6.6 g/dL (ref 6.4–8.2)

## 2021-03-18 LAB — EKG 12-LEAD
Atrial Rate: 81 {beats}/min
Diagnosis: NORMAL
P Axis: 86 degrees
P-R Interval: 154 ms
Q-T Interval: 374 ms
QRS Duration: 82 ms
QTc Calculation (Bazett): 434 ms
R Axis: -65 degrees
T Axis: 65 degrees
Ventricular Rate: 81 {beats}/min

## 2021-03-18 LAB — CBC WITH AUTO DIFFERENTIAL
Basophils %: 0 % (ref 0–1)
Basophils Absolute: 0 10*3/uL (ref 0.0–0.1)
Eosinophils %: 1 % (ref 0–7)
Eosinophils Absolute: 0.1 10*3/uL (ref 0.0–0.4)
Granulocyte Absolute Count: 0.1 10*3/uL — ABNORMAL HIGH (ref 0.00–0.04)
Hematocrit: 34.8 % — ABNORMAL LOW (ref 35.0–47.0)
Hemoglobin: 11.7 g/dL (ref 11.5–16.0)
Immature Granulocytes: 1 % — ABNORMAL HIGH (ref 0.0–0.5)
Lymphocytes %: 2 % — ABNORMAL LOW (ref 12–49)
Lymphocytes Absolute: 0.1 10*3/uL — ABNORMAL LOW (ref 0.8–3.5)
MCH: 30.5 PG (ref 26.0–34.0)
MCHC: 33.6 g/dL (ref 30.0–36.5)
MCV: 90.9 FL (ref 80.0–99.0)
MPV: 9.2 FL (ref 8.9–12.9)
Monocytes %: 8 % (ref 5–13)
Monocytes Absolute: 0.6 10*3/uL (ref 0.0–1.0)
NRBC Absolute: 0 10*3/uL (ref 0.00–0.01)
Neutrophils %: 88 % — ABNORMAL HIGH (ref 32–75)
Neutrophils Absolute: 6.3 10*3/uL (ref 1.8–8.0)
Nucleated RBCs: 0 PER 100 WBC
Platelets: 188 10*3/uL (ref 150–400)
RBC: 3.83 M/uL (ref 3.80–5.20)
RDW: 12.1 % (ref 11.5–14.5)
WBC: 7.2 10*3/uL (ref 3.6–11.0)

## 2021-03-18 LAB — TROPONIN, HIGH SENSITIVITY
Troponin, High Sensitivity: 57 ng/L — ABNORMAL HIGH (ref 0–51)
Troponin, High Sensitivity: 55 ng/L — ABNORMAL HIGH (ref 0–51)

## 2021-03-18 LAB — COVID-19, RAPID: SARS-CoV-2, Rapid: DETECTED — AB

## 2021-03-18 MED ORDER — ACETAMINOPHEN 325 MG TABLET
325 mg | ORAL | Status: AC
Start: 2021-03-18 — End: 2021-03-18
  Administered 2021-03-18: 13:00:00 via ORAL

## 2021-03-18 MED ORDER — SODIUM CHLORIDE 0.9% BOLUS IV
0.9 % | Freq: Once | INTRAVENOUS | Status: AC
Start: 2021-03-18 — End: 2021-03-18
  Administered 2021-03-18: 13:00:00 via INTRAVENOUS

## 2021-03-18 MED FILL — SODIUM CHLORIDE 0.9 % IV: INTRAVENOUS | Qty: 1000

## 2021-03-18 MED FILL — MAPAP (ACETAMINOPHEN) 325 MG TABLET: 325 mg | ORAL | Qty: 2

## 2021-03-18 NOTE — ED Notes (Signed)
Pt presents to ER from home via EMS w/ complaints of fever, chills, and body aches since this morning. Assisted to position of comfort, call bell within reach.

## 2021-03-18 NOTE — ED Provider Notes (Signed)
ED Provider Notes by Jonathon Resides, MD at 03/18/21 [phone]                Author: Jonathon Resides, MD  Service: Emergency Medicine  Author Type: Resident       Filed: 03/18/21 1648  Date of Service: 03/18/21 0736  Status: Attested           Editor: Jonathon Resides, MD (Resident)  Cosigner: Katha Cabal I, MD at 03/19/21 [phone]          Attestation signed by Alex Gardener, MD at 03/19/21 [phone]          This service has been performed in part by a resident under the direction of a teaching physician.      I, Dr. Katha Cabal, MD, [facility], personally saw the patient, performed critical or key portions of the service, and discussed the care with the resident.  I agree with the resident's findings as noted  in the history, review of systems, and physical exam.  I have reviewed all labs and imaging studies associated with the patient's visit.      Summary of evaluation and plan: Fatigue and weakness, COVID+ today.  No other significant abnormalities.  Symptomatic care, DC home      Procedures Supervised by Teaching Physician:  None         Critical Care Provided by Teaching Physician:   Time the teaching physician spent providing critical care:  None                                                   65 y/o female with history as below presenting today complaining of generalized weakness and fatigue this morning.  Patient reports that she woke up this morning feeling tired, walked to get water in the kitchen and felt more generally weak. She reports that she has been in her usual state of health up until this point. Reports fever this morning with EMS, per EMS  report temperature was 99 F. She denies any chest pain, shortness of breath, chills, abdominal pain, chest pain, nausea, vomiting or diarrhea. No upper respiratory symptoms.                Past Medical History:        Diagnosis  Date         ?  Arthritis            unknown type - back         ?  CAD (coronary artery disease)             stent placed in 2006 [place];  followed by Dr Henriette Combs         ?  Cancer [facility])            colon ca         ?  Chronic obstructive pulmonary disease ([place])            managed by PCP         ?  Hypercholesteremia       ?  Menopause       ?  MI (myocardial infarction) ([place])  2006          in [place]         ?  Psychiatric disorder  schizophrenia         ?  Schizophrenia ([place])            Dr Beverlee Nims on Lauralyn Primes             Past Surgical History:         Procedure  Laterality  Date          ?  COLONOSCOPY  N/A  08/04/2017          COLONOSCOPY performed by Eugenie Filler., MD at [facility]          ?  COLONOSCOPY,REMV LESN,SNARE    08/04/2017                     ?  COLORECTAL SCRN; HI RISK IND    11/01/2013                     ?  COLORECTAL SCRN; HI RISK IND    08/04/2017                     ?  HX HEENT              teeth removal          ?  PR CARDIAC SURG PROCEDURE UNLIST    2006          stent placed               Family History:         Problem  Relation  Age of Onset          ?  Heart Attack  Father       ?  Cancer  Mother                breast          ?  Breast Cancer  Mother                68's          ?  Cancer  Brother                kidney             Social History          Socioeconomic History         ?  Marital status:  WIDOWED              Spouse name:  Not on file         ?  Number of children:  2     ?  Years of education:  10     ?  Highest education level:  Not on file       Occupational History        ?  Not on file       Tobacco Use         ?  Smoking status:  Former              Packs/day:  1.50         Years:  42.00         Pack years:  63.00         Types:  Cigarettes         ?  Smokeless tobacco:  Current        ?  Tobacco comments:  vaping       Substance and Sexual Activity         ?  Alcohol use:  Not Currently     ?  Drug use:  No     ?  Sexual activity:  Never        Other Topics  Concern         ?  Military Service  No     ?  Blood Transfusions  No     ?  Caffeine Concern  No     ?   Occupational Exposure  No     ?  Hobby Hazards  No     ?  Sleep Concern  Yes     ?  Stress Concern  Yes     ?  Weight Concern  No     ?  Special Diet  No     ?  Back Care  No     ?  Exercise  No     ?  Bike Helmet  No     ?  Seat Belt  Yes     ?  Self-Exams  No       Social History Narrative        ?  Not on file          Social Determinants of Health          Financial Resource Strain: Not on file     Food Insecurity: Not on file     Transportation Needs: Not on file     Physical Activity: Not on file     Stress: Not on file     Social Connections: Not on file     Intimate Partner Violence: Not on file       Housing Stability: Not on file              ALLERGIES: Patient has no known allergies.      Review of Systems    Constitutional:  Positive for fatigue and fever . Negative for chills.    HENT:  Negative for congestion and sore throat.     Eyes:  Negative for visual disturbance.    Respiratory:  Negative for cough, shortness of breath and wheezing.     Cardiovascular:  Negative for chest pain.    Gastrointestinal:  Negative for abdominal pain, nausea and vomiting.    Endocrine: Negative for polyuria.    Genitourinary:  Negative for difficulty urinating and dysuria.    Musculoskeletal:  Negative for back pain.    Skin:  Negative for rash.    Neurological:  Negative for headaches.    Psychiatric/Behavioral:  Negative for confusion.        There were no vitals filed for this visit.           Physical Exam   Vitals and nursing note reviewed.    Constitutional:        General: She is not in acute distress.      Appearance: She is not ill-appearing or toxic-appearing.    HENT:       Head: Normocephalic and atraumatic.       Nose: Nose normal.       Mouth/Throat:       Mouth: Mucous membranes are moist.    Eyes:       Extraocular Movements: Extraocular movements intact.       Pupils: Pupils are equal, round, and reactive to light.  Cardiovascular:       Rate and Rhythm: Normal rate and regular rhythm.       Pulses:  Normal pulses.    Pulmonary:       Effort: Pulmonary effort is normal.       Breath sounds: No wheezing.       Comments: Slightly diminished breath sounds throughout     Abdominal:       General: There is no distension.       Palpations: Abdomen is soft.       Tenderness: There is no abdominal tenderness.    Skin:      General: Skin is warm and dry.    Neurological:       General: No focal deficit present.       Mental Status: She is alert and oriented to person, place, and time.    Psychiatric:          Mood and Affect: Mood normal.          Behavior: Behavior normal.           MDM   Number of Diagnoses or Management Options   COVID-19   Diagnosis management comments: 65 y/o female with history of schizophrenia, COPD, prior MI presenting today with generalized fatigue and weakness. She is afebrile here, hemodynamically stable. Saturations are 99% on room air. Heart sounds normal, lungs  clear. Abdomen is soft, nondistended, and nontender. Differential includes viral illness, electrolyte imbalance, orthostatic hypotension, UTI. No focal deficits on exam concerning for neurologic process like CVA. Afebrile here, low concern for systemic  bacterial infection. Low concern for COPD exacerbation, minimal wheezing and no respiratory distress on exam. Will work up for infectious process with CXR, UA, viral swabs. Obtaining basic labs including CBC, CMP. Obtaining trop and EKG to r/o ACS. Disposition  pending further workup.          Amount and/or Complexity of Data Reviewed   Decide to obtain previous medical records or to obtain history from someone other than the patient: yes           ED Course as of 03/18/21 1648       Mon Mar 18, 2021        0818  Patient is COVID positive, likely source of symptoms. No leukocytosis. [SH]     2585  CXR with no acute process. Chemistry hemolyzed in lab. If normal results, will discharge home with return precautions.  [SH]     2778  CMP negative.  [SH]     0946  Trop with mild  elevation, no symptoms of ACS. Will repeat value.  [SH]     1102  Urine clear, repeat trop down trending. Will discharge home. Discussed return precautions, recommend close follow up with her PCP. Discussed quarantine  recommendations. She expresses understanding and agreement to plan. [SH]              ED Course User Index   [SH] Jonathon Resides, MD           Procedures

## 2021-04-16 ENCOUNTER — Inpatient Hospital Stay: Payer: MEDICARE

## 2021-04-16 MED ORDER — EPINEPHRINE 0.1 MG/ML SYRINGE
0.1 mg/mL | Freq: Once | INTRAMUSCULAR | Status: DC | PRN
Start: 2021-04-16 — End: 2021-04-16

## 2021-04-16 MED ORDER — SODIUM CHLORIDE 0.9 % IV
INTRAVENOUS | Status: DC
Start: 2021-04-16 — End: 2021-04-16
  Administered 2021-04-16: 17:00:00 via INTRAVENOUS

## 2021-04-16 MED ORDER — MIDAZOLAM 1 MG/ML IJ SOLN
1 mg/mL | INTRAMUSCULAR | Status: DC | PRN
Start: 2021-04-16 — End: 2021-04-16

## 2021-04-16 MED ORDER — LIDOCAINE (PF) 20 MG/ML (2 %) IJ SOLN
20 mg/mL (2 %) | INTRAMUSCULAR | Status: DC | PRN
Start: 2021-04-16 — End: 2021-04-16
  Administered 2021-04-16: 18:00:00 via INTRAVENOUS

## 2021-04-16 MED ORDER — PROPOFOL 10 MG/ML IV EMUL
10 mg/mL | INTRAVENOUS | Status: DC | PRN
Start: 2021-04-16 — End: 2021-04-16
  Administered 2021-04-16 (×6): via INTRAVENOUS

## 2021-04-16 MED ORDER — ATROPINE 0.1 MG/ML SYRINGE
0.1 mg/mL | Freq: Once | INTRAMUSCULAR | Status: DC | PRN
Start: 2021-04-16 — End: 2021-04-16

## 2021-04-16 MED ORDER — SODIUM CHLORIDE 0.9 % IJ SYRG
Freq: Three times a day (TID) | INTRAMUSCULAR | Status: DC
Start: 2021-04-16 — End: 2021-04-16

## 2021-04-16 MED ORDER — FLUMAZENIL 0.1 MG/ML IV SOLN
0.1 mg/mL | INTRAVENOUS | Status: DC | PRN
Start: 2021-04-16 — End: 2021-04-16

## 2021-04-16 MED ORDER — NALOXONE 0.4 MG/ML INJECTION
0.4 mg/mL | INTRAMUSCULAR | Status: DC | PRN
Start: 2021-04-16 — End: 2021-04-16

## 2021-04-16 MED ORDER — SODIUM CHLORIDE 0.9 % IJ SYRG
INTRAMUSCULAR | Status: DC | PRN
Start: 2021-04-16 — End: 2021-04-16

## 2021-04-16 MED ORDER — SIMETHICONE 40 MG/0.6 ML ORAL DROPS, SUSP
40 mg/0.6 mL | ORAL | Status: DC | PRN
Start: 2021-04-16 — End: 2021-04-16

## 2021-04-16 MED FILL — BD POSIFLUSH NORMAL SALINE 0.9 % INJECTION SYRINGE: INTRAMUSCULAR | Qty: 40

## 2021-04-16 MED FILL — SODIUM CHLORIDE 0.9 % IV: INTRAVENOUS | Qty: 1000

## 2021-04-16 NOTE — Interval H&P Note (Signed)
See anesthesia note and MAR for medications given during procedure.  Received report from anesthesia staff on vital signs and status of patient.     Endoscope was pre-cleaned at the bedside immediately following procedure by Evelena Peat, RN

## 2021-04-16 NOTE — Anesthesia Post-Procedure Evaluation (Signed)
 Formatting of this note is different from the original.    Procedure(s):  COLONOSCOPY  ENDOSCOPIC POLYPECTOMY.    MAC    Anesthesia Post Evaluation    Patient location during evaluation: PACU  Note status: Adequate.  Level of consciousness: responsive to verbal stimuli and sleepy but conscious  Pain management: satisfactory to patient  Airway patency: patent  Anesthetic complications: no  Cardiovascular status: acceptable  Respiratory status: acceptable  Hydration status: acceptable  Comments: +Post-Anesthesia Evaluation and Assessment    Patient: Claudia [name] MRN: [id number]  SSN: [id number]   Date of Birth: [birth year]  Age: 65 y.o.  Sex: female     Cardiovascular Function/Vital Signs    BP (!) 144/64   Pulse 64   Temp 36.2 C (97.2 F)   Resp 15   Ht 5\' 4"  (1.626 m)   Wt 59 kg (130 lb)   SpO2 96%   Breastfeeding No   BMI 22.31 kg/m     Patient is status post Procedure(s):  COLONOSCOPY  ENDOSCOPIC POLYPECTOMY.    Nausea/Vomiting: Controlled.    Postoperative hydration reviewed and adequate.    Pain:  Pain Scale 1: Numeric (0 - 10) (04/16/21 1309)  Pain Intensity 1: 0 (04/16/21 1309)   Managed.    Neurological Status:     At baseline.    Mental Status and Level of Consciousness: Arousable.    Pulmonary Status:   O2 Device: None (Room air) (04/16/21 1309)   Adequate oxygenation and airway patent.    Complications related to anesthesia: None    Post-anesthesia assessment completed. No concerns.    Signed By: Purvis Bruckner, DO    04/16/2021  Post anesthesia nausea and vomiting:  controlled    INITIAL Post-op Vital signs:   Vitals Value Taken Time   BP 160/64 04/16/21 1315   Temp     Pulse 54 04/16/21 1317   Resp 15 04/16/21 1317   SpO2 97 % 04/16/21 1317   Vitals shown include unvalidated device data.  Electronically signed by Purvis Bruckner, DO at 04/16/2021  3:58 PM EST

## 2021-04-16 NOTE — Anesthesia Post-Procedure Evaluation (Signed)
Procedure(s):  COLONOSCOPY  ENDOSCOPIC POLYPECTOMY.    MAC    Anesthesia Post Evaluation        Patient location during evaluation: PACU  Note status: Adequate.  Level of consciousness: responsive to verbal stimuli and sleepy but conscious  Pain management: satisfactory to patient  Airway patency: patent  Anesthetic complications: no  Cardiovascular status: acceptable  Respiratory status: acceptable  Hydration status: acceptable  Comments: +Post-Anesthesia Evaluation and Assessment    Patient: Claudia [name] MRN: [id number]  SSN: [id number]   Date of Birth: Nov 20, 1955  Age: 65 y.o.  Sex: female      Cardiovascular Function/Vital Signs    BP (!) 144/64    Pulse 64    Temp 36.2 ??C (97.2 ??F)    Resp 15    Ht _0  (1.626 m)    Wt 59 kg (130 lb)    SpO2 96%    Breastfeeding No    BMI 22.31 kg/m??     Patient is status post Procedure(s):  COLONOSCOPY  ENDOSCOPIC POLYPECTOMY.    Nausea/Vomiting: Controlled.    Postoperative hydration reviewed and adequate.    Pain:  Pain Scale 1: Numeric (0 - 10) (04/16/21 1309)  Pain Intensity 1: 0 (04/16/21 1309)   Managed.    Neurological Status:       At baseline.    Mental Status and Level of Consciousness: Arousable.    Pulmonary Status:   O2 Device: None (Room air) (04/16/21 1309)   Adequate oxygenation and airway patent.    Complications related to anesthesia: None    Post-anesthesia assessment completed. No concerns.    Signed By: Talmage Coin, DO    04/16/2021  Post anesthesia nausea and vomiting:  controlled      INITIAL Post-op Vital signs:   Vitals Value Taken Time   BP 160/64 04/16/21 1315   Temp     Pulse 54 04/16/21 1317   Resp 15 04/16/21 1317   SpO2 97 % 04/16/21 1317   Vitals shown include unvalidated device data.

## 2021-04-16 NOTE — Procedures (Signed)
Procedures by Eugenie Filler., MD at 04/16/21 1254                Author: Eugenie Filler., MD  Service: Gastroenterology  Author Type: Physician       Filed: 04/16/21 1257  Date of Service: 04/16/21 1254  Status: Signed          Editor: Eugenie Filler., MD (Physician)                  [facility]                        Colonoscopy Operative Report      04/16/2021         Claudia [name]   [id number]   26-Jul-1955      Procedure Type:   Colonoscopy --screening       Indications:    Personal history of colon cancer (screening only), Personal history of colon polyps (screening only)       Pre-operative Diagnosis: see indication above      Post-operative Diagnosis:  See findings below      Operator:  Elmon Else, MD      Referring Provider: Marisa Severin, DO         Sedation:  MAC anesthesia Propofol      Pre-Procedural Exam:        Airway: clear,  No airway problems anticipated   Heart: RRR, without gallops or rubs   Lungs: clear bilaterally without wheezes, crackles, or rhonchi   Abdomen: soft, nontender, nondistended, bowel sounds present   Mental Status: awake, alert and oriented to person, place and time       Procedure Details:  After informed consent was obtained with all risks and benefits of procedure explained and preoperative exam completed,  the patient was taken to the endoscopy suite and placed in the left lateral decubitus position.  Upon sequential sedation as per above, a digital rectal exam was performed .  The Olympus videocolonoscope  was inserted in the rectum and carefully advanced  to the cecum, which was identified by the ileocecal valve and appendiceal orifice.  The cecum was identified by the ileocecal valve and appendiceal orifice.  The quality of preparation was adequate.  The colonoscope was slowly withdrawn with careful evaluation  between folds. Retroflexion in the rectum was completed demonstrating  internal hemorrhoids.       Findings:    Rectum: Grade 1 internal hemorrhoid(s);   Sigmoid: surgically absent   Descending Colon: normal   Transverse Colon: 7 mm polyp, cold snared   Ascending Colon: normal   Cecum: 12 mm polyp, on fold at top of cecum, hot snared   Terminal Ileum: not intubated         Specimen Removed:   1. Transverse colon polyp, cold snared  2 cecal polyp, hot snared      Complications: None.       EBL:  None.      Impression:    evidence of prior surgery , sigmoid colectomy   hemorrhoids internal, Moderate in size   Transverse and cecal polyps      Recommendations: --Await pathology., -Repeat colonoscopy in 3 years. High fiber diet.  Resume normal medication(s).    Discharge Disposition:  Home in the company of a driver when able to ambulate.      Eugenie Filler., MD  04/16/2021       P. Jethro Bolus, MD   [facility], [place], [place] [address]   [phone]   [email or web address]

## 2021-04-16 NOTE — Anesthesia Pre-Procedure Evaluation (Signed)
 Formatting of this note is different from the original.  Anesthetic History   No history of anesthetic complications        Review of Systems / Medical History  Patient summary reviewed, nursing notes reviewed and pertinent labs reviewed    Pulmonary    COPD: mild    Smoker    Comments: Smoker - 1.5 ppd x 42 years   Neuro/Psych     Psychiatric history    Comments: Schizophrenia Cardiovascular    Past MI, CAD and cardiac stents    Exercise tolerance: >4 METS  Comments: MI 2006   GI/Hepatic/Renal    GERD: well controlled    PUD    Comments: H/O Colon Cancer s/p Sigmoid Colectomy (12/23/12) Endo/Other    Obesity, arthritis and cancer     Other Findings   Comments: Colon Cancer    schizophrenia     Physical Exam    Airway  Mallampati: I  TM Distance: > 6 cm  Neck ROM: normal range of motion   Mouth opening: Normal     Cardiovascular  Regular rate and rhythm,  S1 and S2 normal,  no murmur, click, rub, or gallop     Dental    Dentition: Edentulous    Pulmonary  Breath sounds clear to auscultation    Comments: Clear but distant breath sounds Abdominal  GI exam deferred     Other Findings       Anesthetic Plan    ASA: 3  Anesthesia type: MAC    Induction: Intravenous  Anesthetic plan and risks discussed with: Patient and Family          Electronically signed by Margrett Sherry, MD at 04/16/2021 12:04 PM EST

## 2021-04-16 NOTE — H&P (Signed)
H&P by Eugenie Filler., MD at 04/16/21 1233                Author: Eugenie Filler., MD  Service: Gastroenterology  Author Type: Physician       Filed: 04/16/21 1234  Date of Service: 04/16/21 1233  Status: Signed          Editor: Eugenie Filler., MD (Physician)                                            Jeanine Luz, MD   [facility], [place], [place] [address]   [phone]   [email or web address]      Gastroenterology Outpatient History and Physical      Patient: Claudia [name]      Physician: Eugenie Filler., MD      Vital Signs: Blood pressure  (!) 145/64, pulse 62, temperature 98 ??F (36.7 ??C), resp. rate 13, height 5\' 4"  (1.626 m), weight 59 kg (130 lb), SpO2 97 %, not currently breastfeeding.      Allergies: No Known Allergies      Chief Complaint: Hx of polyps      History of Present Illness: Personal history of colonic polyps and colon cancer. Last colonoscopy was 2019 Currently has no GI symptoms. No  FH of colon cancer or polyps.         History:     Past Medical History:        Diagnosis  Date         ?  Arthritis            unknown type - back         ?  CAD (coronary artery disease)             stent placed in 2006 [place]; followed by Dr Henriette Combs         ?  Cancer [facility])            colon ca         ?  Chronic obstructive pulmonary disease ([place])            managed by PCP         ?  Hypercholesteremia       ?  Menopause       ?  MI (myocardial infarction) ([place])  2006          in [place]         ?  Psychiatric disorder            schizophrenia         ?  Schizophrenia ([place])            Dr Beverlee Nims on Lauralyn Primes           Past Surgical History:         Procedure  Laterality  Date          ?  COLONOSCOPY  N/A  08/04/2017          COLONOSCOPY performed by Eugenie Filler., MD at [facility]          ?  COLONOSCOPY,REMV LESN,SNARE    08/04/2017                     ?  COLORECTAL  SCRN; HI RISK IND    11/01/2013                     ?  COLORECTAL SCRN; HI RISK IND    08/04/2017                     ?  HX HEENT              teeth removal          ?  PR CARDIAC SURG PROCEDURE UNLIST    2006          stent placed           Social History          Socioeconomic History         ?  Marital status:  WIDOWED     ?  Number of children:  2     ?  Years of education:  10       Tobacco Use         ?  Smoking status:  Former              Packs/day:  1.50         Years:  42.00         Pack years:  63.00         Types:  Cigarettes         ?  Smokeless tobacco:  Current        ?  Tobacco comments:             vaping       Substance and Sexual Activity         ?  Alcohol use:  Not Currently     ?  Drug use:  No     ?  Sexual activity:  Never        Other Topics  Concern         ?  Military Service  No     ?  Blood Transfusions  No     ?  Caffeine Concern  No     ?  Occupational Exposure  No     ?  Hobby Hazards  No     ?  Sleep Concern  Yes     ?  Stress Concern  Yes     ?  Weight Concern  No     ?  Special Diet  No     ?  Back Care  No     ?  Exercise  No     ?  Bike Helmet  No     ?  Seat Belt  Yes         ?  Self-Exams  No           Family History         Problem  Relation  Age of Onset          ?  Heart Attack  Father       ?  Cancer  Mother                breast          ?  Breast Cancer  Mother                19's          ?  Cancer  Brother  kidney           Patient Active Problem List        Diagnosis  Code         ?  Colon cancer ([place])  C18.9     ?  UTI (lower urinary tract infection)  N39.0         ?  Unstable angina ([facility])  I20.0           Medications:      Prior to Admission medications             Medication  Sig  Start Date  End Date  Taking?  Authorizing Provider            clopidogreL (PLAVIX) 75 mg tab  Take  by mouth.      Yes  Provider, Historical     lidocaine (LIDODERM) 5 %  Apply patch to the affected area for 12 hours a day and remove for 12 hours a day.  01/24/17    Yes  Fox,  [place], [place]     traZODone (DESYREL) 100 mg tablet  Take 100 mg by mouth nightly.      Yes  Provider, Historical     loratadine (CLARITIN) 10 mg tablet  Take 10 mg by mouth daily.      Yes  Other, Phys, MD     ezetimibe (ZETIA) 10 mg tablet  Take 5 mg by mouth daily.      Yes  Other, Phys, MD     albuterol (PROVENTIL VENTOLIN) 2.5 mg /3 mL (0.083 %) nebulizer solution  2.5 mg by Nebulization route every six (6) hours.      Yes  Other, Phys, MD     docusate sodium (COLACE) 100 mg capsule  Take 1 Cap by mouth two (2) times a day.  12/23/12    Yes  Rona Ravens D, MD     tiotropium [facility]) 18 mcg inhalation capsule  Take 1 Cap by inhalation daily.      Yes  Provider, Historical     atorvastatin (LIPITOR) 40 mg tablet  Take  by mouth every morning. Indications: HYPERCHOLESTEROLEMIA      Yes  Provider, Historical     cholecalciferol (VITAMIN D3) (1000 Units /25 mcg) tablet  Take  by mouth daily.      Yes  Provider, Historical     aspirin 81 mg tablet  Take 81 mg by mouth daily.      Yes  Other, Phys, MD     citalopram (CELEXA) 40 mg tablet  Take 40 mg by mouth every morning.      Yes  Other, Phys, MD     benztropine (COGENTIN) 0.5 mg tablet  Take 0.5 mg by mouth nightly.  06/25/09    Yes  Other, Phys, MD     OLANZapine (ZyPREXA) 15 mg tablet  Take 15 mg by mouth nightly. Indications: SCHIZOPHRENIA      Yes  Other, Phys, MD     nicotine polacrilex 4 mg lozenge  4 mg by Buccal route as needed for Smoking Cessation.   Patient not taking: Reported on 04/16/2021        Other, Phys, MD            promethazine-dextromethorphan (PROMETHAZINE-DM) 6.25-15 mg/5 mL syrup  Take 5 mL by mouth every four (4) hours as needed for Cough.   Patient not taking: Reported on 04/16/2021        Other, Phys, MD  Physical Exam:         General: well developed, well nourished     HEENT: unremarkable     Heart: regular rhythm no mumur      Lungs: clear     Abdominal:  benign     Neurological: unremarkable       Extremities: no edema         Findings/Diagnosis: Personal history of colonic polyps   Plan of Care/Planned Procedure: Colonoscopy with monitored anesthesia care sedation      Signed:   Eugenie Filler., MD 04/16/2021

## 2021-04-16 NOTE — Anesthesia Pre-Procedure Evaluation (Signed)
Anesthetic History   No history of anesthetic complications            Review of Systems / Medical History  Patient summary reviewed, nursing notes reviewed and pertinent labs reviewed    Pulmonary    COPD: mild      Smoker      Comments: Smoker - 1.5 ppd x 42 years   Neuro/Psych         Psychiatric history    Comments: Schizophrenia Cardiovascular              Past MI, CAD and cardiac stents    Exercise tolerance: >4 METS  Comments: MI 2006   GI/Hepatic/Renal     GERD: well controlled      PUD    Comments: H/O Colon Cancer s/p Sigmoid Colectomy (12/23/12) Endo/Other        Obesity, arthritis and cancer     Other Findings   Comments: Colon Cancer    schizophrenia           Physical Exam    Airway  Mallampati: I  TM Distance: > 6 cm  Neck ROM: normal range of motion   Mouth opening: Normal     Cardiovascular  Regular rate and rhythm,  S1 and S2 normal,  no murmur, click, rub, or gallop             Dental    Dentition: Edentulous     Pulmonary  Breath sounds clear to auscultation              Comments: Clear but distant breath sounds Abdominal  GI exam deferred       Other Findings            Anesthetic Plan    ASA: 3  Anesthesia type: MAC          Induction: Intravenous  Anesthetic plan and risks discussed with: Patient and Family

## 2021-04-19 ENCOUNTER — Encounter

## 2021-05-23 IMAGING — CT ABDOMEN_PELVIS WO(Adult)
2 of 3 series · 12 of 46 positions shown, 14 images · non-contrast
Comparison: No relevant prior studies available.

DIAGNOSTIC STUDIES

EXAM:  CT ABDOMEN AND PELVIS WITHOUT INTRAVENOUS CONTRAST  ([id number])
INDICATION: abd pain. n/v/d N/V/D abd pain onset yesterday , hx of gallbladder removal .
TECHNIQUE: Axial computed tomography images of the abdomen and pelvis without intravenous
contrast.

[Series 2: abdomen_pelvis ax 3.00 br40 s3 · axial · 0.72mm/px · z∈[+1104,+1533]mm · 9 of 165 slices shown, 11 images]
[im 11/165  soft-tissue]
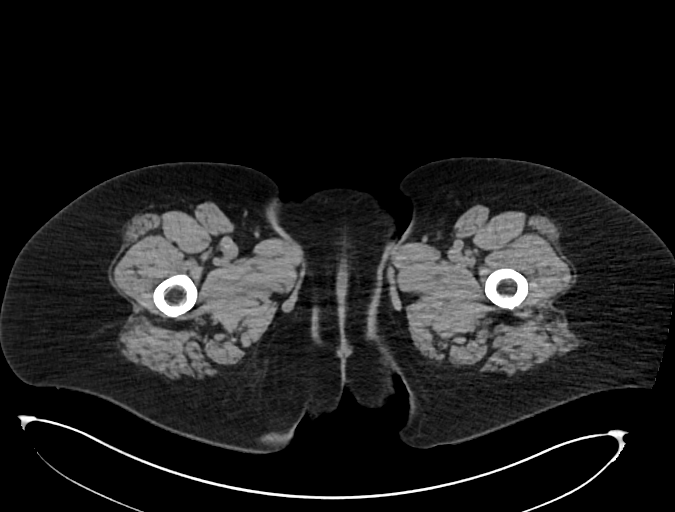
[im 11/165  bone]
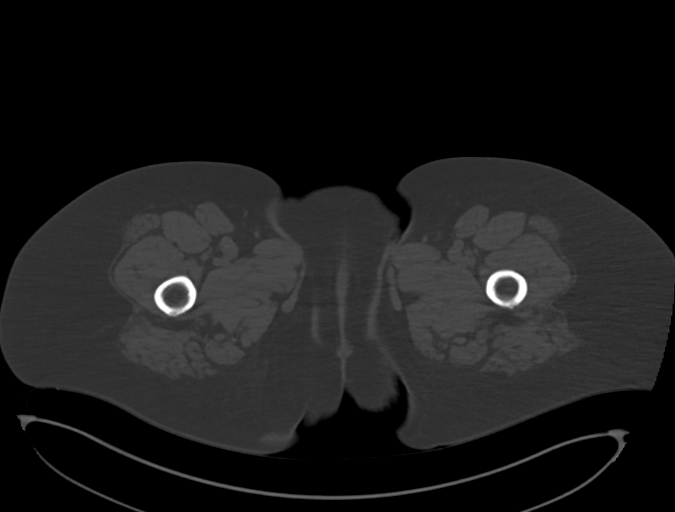
[im 32/165  soft-tissue]
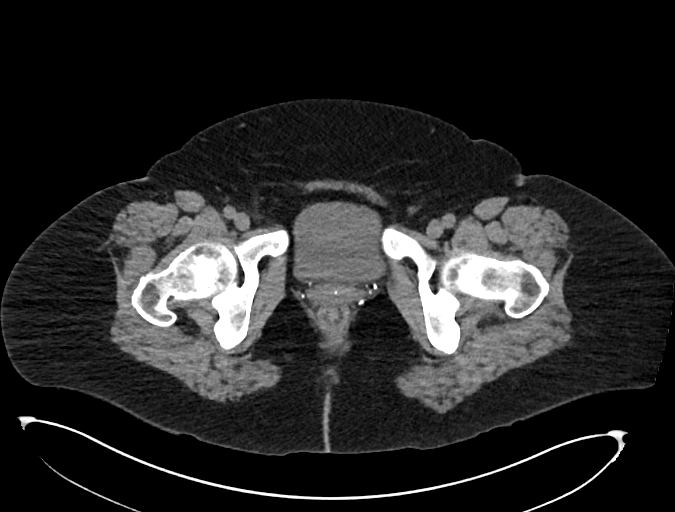
[im 48/165  soft-tissue]
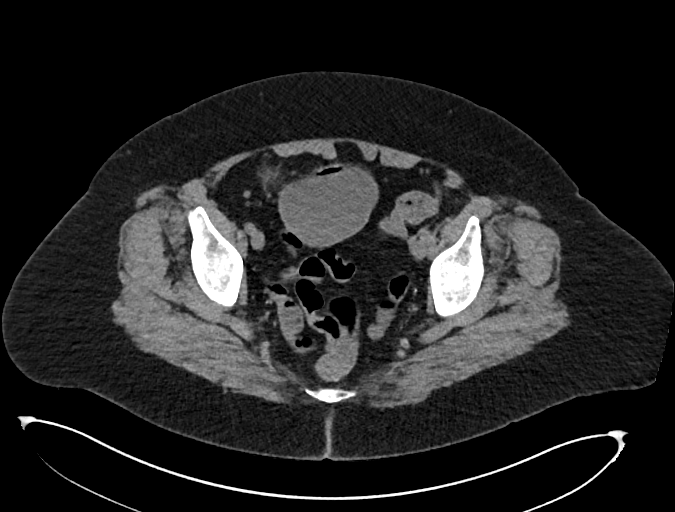
[im 64/165  soft-tissue]
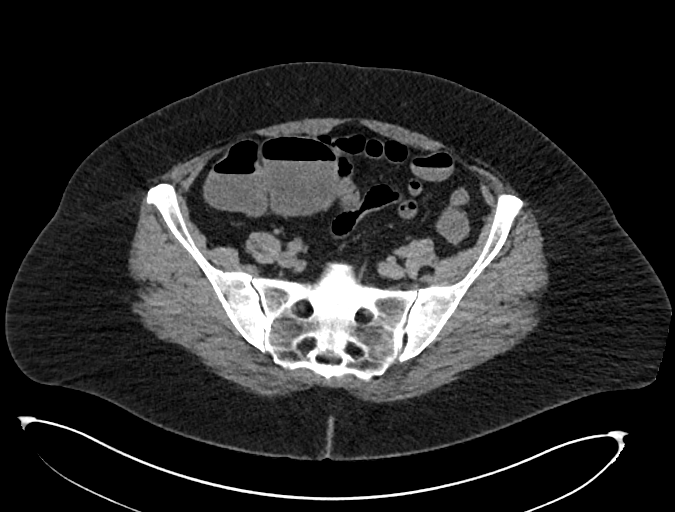
[im 85/165  soft-tissue]
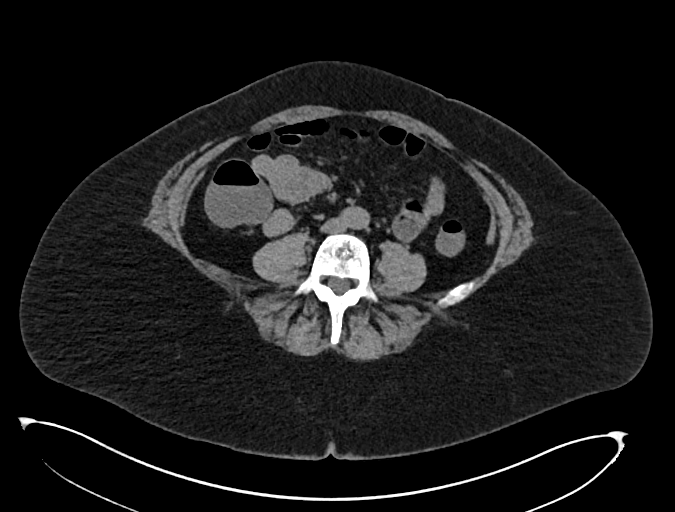
[im 101/165  soft-tissue]
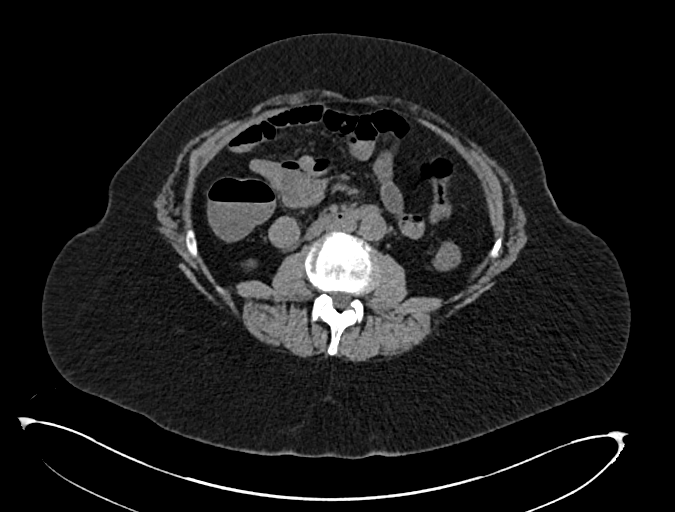
[im 117/165  soft-tissue]
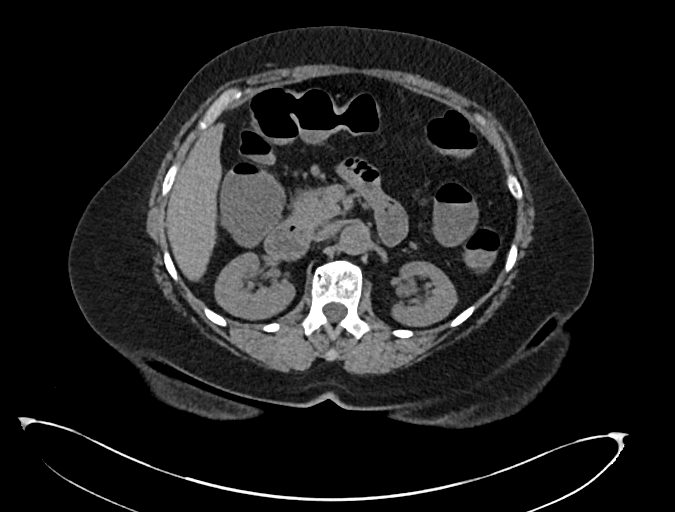
[im 138/165  soft-tissue]
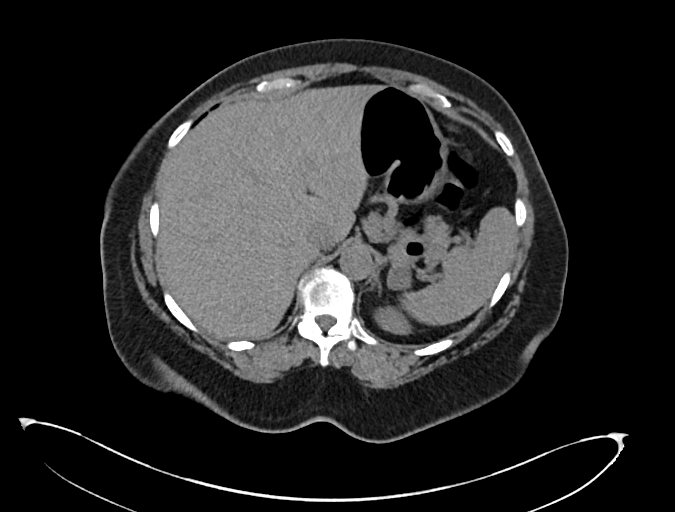
[im 154/165  soft-tissue]
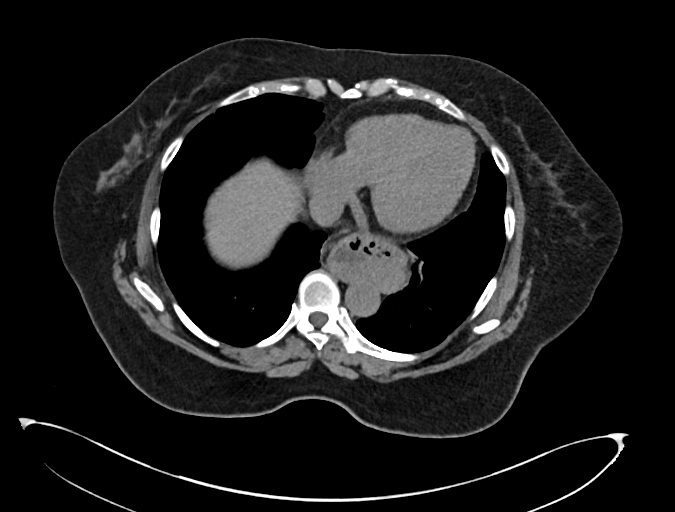
[im 154/165  bone]
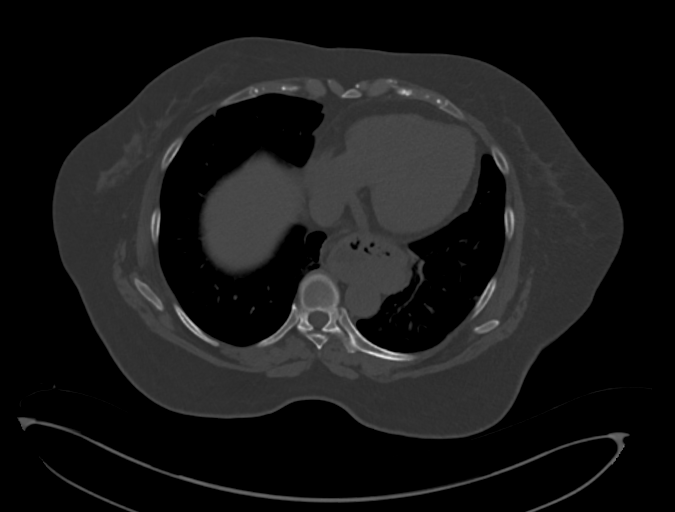

[Series 4: abdomen_pelvis cor 3.00 br40 s3 · coronal · 0.95mm/px · 3 of 122 slices shown]
[im 41/122  soft-tissue]
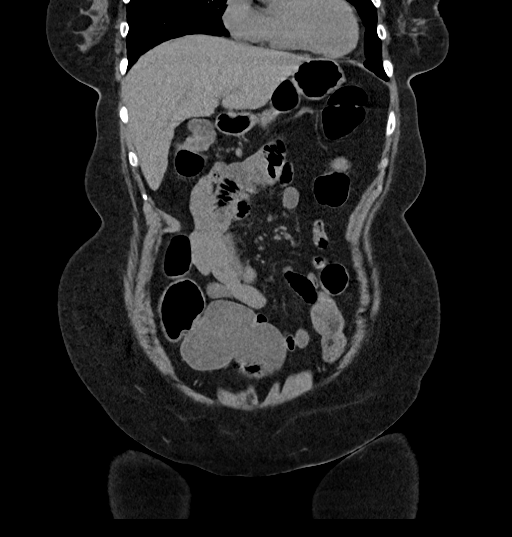
[im 54/122  soft-tissue]
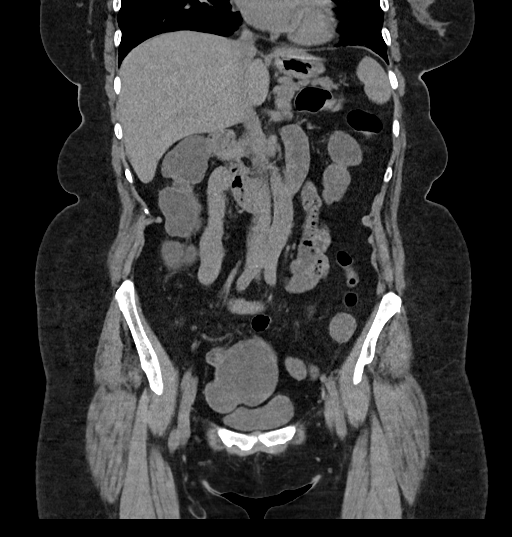
[im 68/122  soft-tissue]
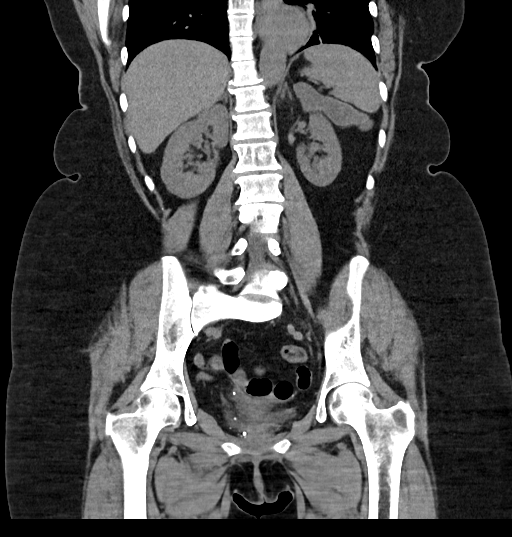

[12 of 46 positions shown; findings below may reference images not displayed]

All CT scans at this facility use dose modulation, interval reconstruction, and/or weight-based
dosing when appropriate to reduce radiation dose to as low as reasonably achievable.

Sagittal and coronal reformatted images were created and reviewed.

All CT scans at this facility use dose modulation, interval reconstruction, and/or weight-based
dosing when appropriate to reduce radiation dose to as low as reasonably achievable.

Number of previous computed tomography exams in the last 12 months is 0. Number of previous nuclear
medicine myocardial perfusion studies in the last 12 months is 0.
FINDINGS: LUNG BASES:  Moderate dependent changes/atelectasis. LEFT greater than RIGHT. Areas of fibrotic
changes and/or atelectasis.

MEDIASTINUM:  Small to moderate hiatal hernia.

ABDOMEN:

LIVER:  NO mass or abnormal enlargement.

GALLBLADDER AND BILE DUCTS:  Postsurgical changes from prior cholecystectomy.

PANCREAS:  NO edema or inflammatory changes. NO significant ductal dilatation. NO definite mass.

SPLEEN:  NO mass or abnormal enlargement.

ADRENALS:  NO mass or significant, abnormal enlargement.

KIDNEYS AND URETERS:  NO dilatation of the renal collecting systems. NO intrarenal stones are
present.

URETERS: NO definite ureteral stones.

STOMACH AND BOWEL:  The stomach demonstrates NO abnormal thickening or distention.

The small bowel demonstrates nonspecific air-fluid levels. NO significant distention.

The colon demonstrates multifocal nonspecific air-fluid levels. NO significant distention.

PELVIS:

APPENDIX:  NO evidence of appendicitis by CT criteria.

BLADDER:  NO significant wall thickening or stones.

REPRODUCTIVE:  PELVIS: Uterus is not visualized.

NO definite masses or cysts greater than 3 cm in the adnexa.

ABDOMEN and PELVIS:

BONES/JOINTS:  NO definite acute compression fractures.

Mild to moderate degenerative changes in the spine.

Hemangiomas at the L4 and L1 levels.

NO evidence of acute fractures in the nonspinal bony structures.

SOFT TISSUES:  Soft tissues demonstrate NO fluid collections or foreign body.

VASCULATURE:  The aorta demonstrates NO aneurysmal distention or rupture.  There is minimal
arterial vascular calcification.

Pelvic calcifications most consistent with phleboliths.

LYMPH NODES:  NO pathologically enlarged lymph nodes.
IMPRESSION: 1.  Small bowel and colonic findings as described. The differential includes ileus and
enteritis/colitis. NO definite evidence of obstruction.

2.  Small to moderate hiatal hernia.

3.  Moderate dependent changes/atelectasis. LEFT greater than RIGHT. Areas of fibrotic changes
and/or atelectasis.

Additional chronic/incidental findings as described.

Tech Notes:

N/V/D abd pain onset yesterday , hx of gallbladder removal .

## 2021-06-03 ENCOUNTER — Encounter

## 2021-06-24 ENCOUNTER — Inpatient Hospital Stay: Admit: 2021-06-24 | Payer: MEDICAID | Attending: Critical Care Medicine | Primary: Family Medicine

## 2021-06-24 ENCOUNTER — Ambulatory Visit: Payer: MEDICARE | Primary: Family Medicine

## 2021-06-24 DIAGNOSIS — Z87891 Personal history of nicotine dependence: Secondary | ICD-10-CM

## 2021-06-24 DIAGNOSIS — Z122 Encounter for screening for malignant neoplasm of respiratory organs: Secondary | ICD-10-CM

## 2021-06-28 ENCOUNTER — Encounter: Admit: 2021-06-28 | Discharge: 2021-06-28 | Payer: MEDICARE

## 2021-07-05 ENCOUNTER — Encounter: Admit: 2021-07-05 | Discharge: 2021-07-05 | Payer: MEDICARE

## 2021-07-05 ENCOUNTER — Ambulatory Visit: Admit: 2021-07-05 | Discharge: 2021-07-06 | Payer: MEDICARE

## 2021-07-05 DIAGNOSIS — R0609 Other forms of dyspnea: Secondary | ICD-10-CM

## 2021-07-05 DIAGNOSIS — R0789 Other chest pain: Secondary | ICD-10-CM

## 2021-07-05 DIAGNOSIS — R002 Palpitations: Secondary | ICD-10-CM

## 2021-07-05 NOTE — Patient Instructions
Thank you for visiting our office today.   We recommend follow-up with our office in 1 year.   Please call [phone] in 7 months to schedule the office visit Dr. Mehta ordered.     Take your medications as ordered.   Check your list with what you have on hand at home.   Should you have any additional questions or concerns, please message me through MyChart or call the office.    Cardiovascular Medicine Team   Clinic phone: [phone]

## 2021-07-11 ENCOUNTER — Encounter

## 2021-08-13 ENCOUNTER — Inpatient Hospital Stay: Admit: 2021-08-13 | Payer: MEDICAID | Attending: Family Medicine | Primary: Family Medicine

## 2021-08-13 DIAGNOSIS — Z1231 Encounter for screening mammogram for malignant neoplasm of breast: Secondary | ICD-10-CM

## 2021-08-31 ENCOUNTER — Encounter

## 2021-10-09 ENCOUNTER — Encounter: Payer: MEDICARE | Attending: Internal Medicine | Primary: Family Medicine

## 2021-10-09 ENCOUNTER — Encounter: Attending: Internal Medicine | Primary: Family Medicine

## 2021-10-15 IMAGING — MR SPLUMBWO
9 of 12 series · 29 of 48 positions shown · non-contrast
Comparison: none

[Series 5: T2 · sagittal · 4.0mm · 0.68mm/px · 3 of 17 slices shown (1 of 4)]
[im 1/17]
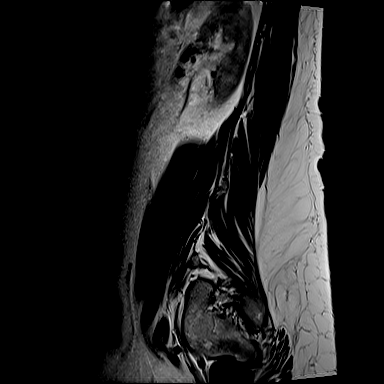
[im 9/17]
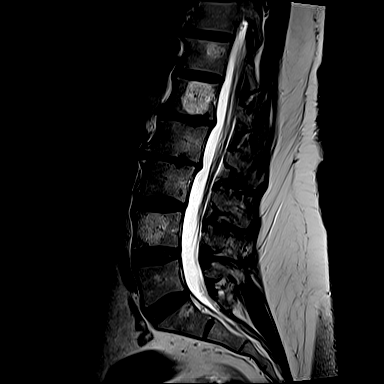
[im 17/17]
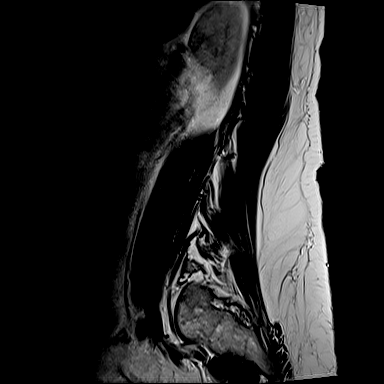

[Series 6: T1 · sagittal · 4.0mm · 0.81mm/px · 3 of 17 slices shown (1 of 4)]
[im 1/17]
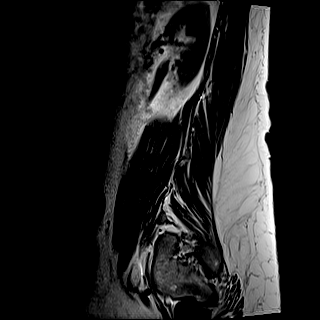
[im 9/17]
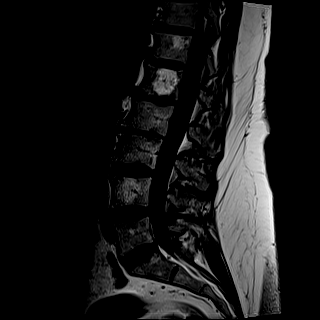
[im 17/17]
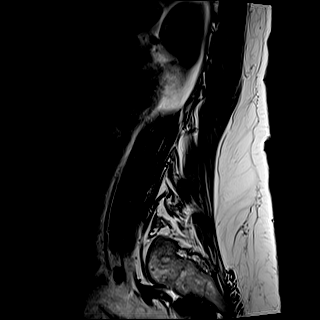

[Series 7: STIR · sagittal · 4.0mm · 0.51mm/px · 3 of 17 slices shown]
[im 1/17]
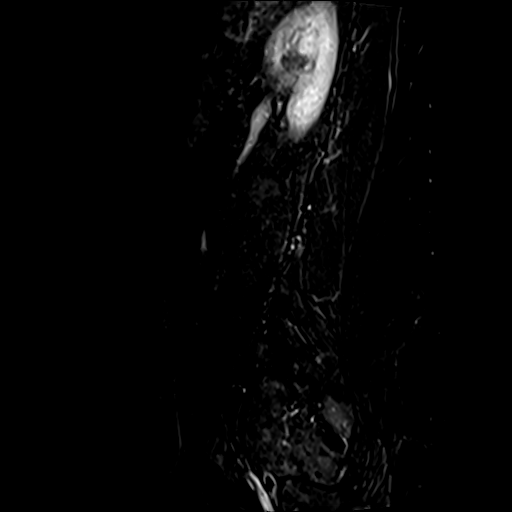
[im 9/17]
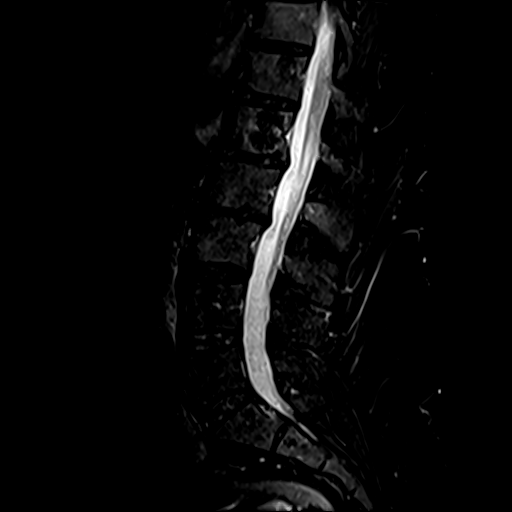
[im 17/17]
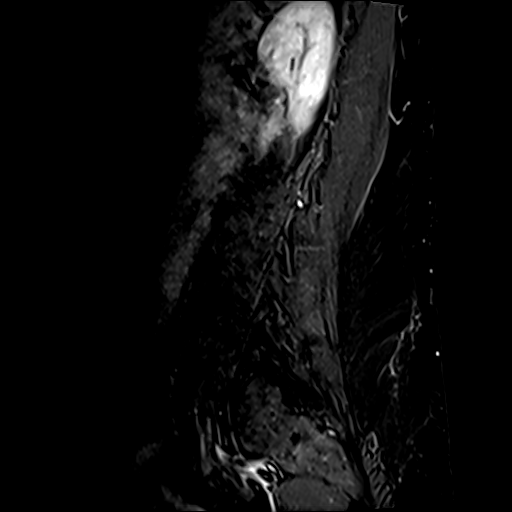

[Series 11: T1 · axial · 4.5mm · 0.43mm/px · z∈[-86,+59]mm · 4 of 28 slices shown (2 of 4)]
[im 1/28]
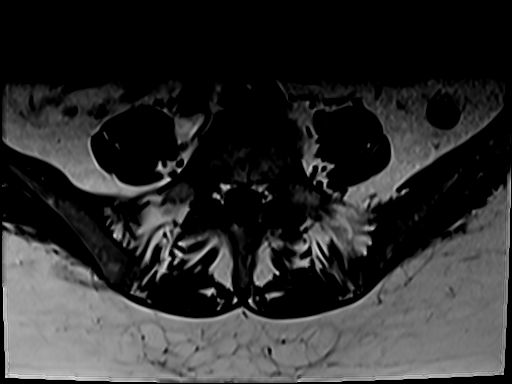
[im 10/28]
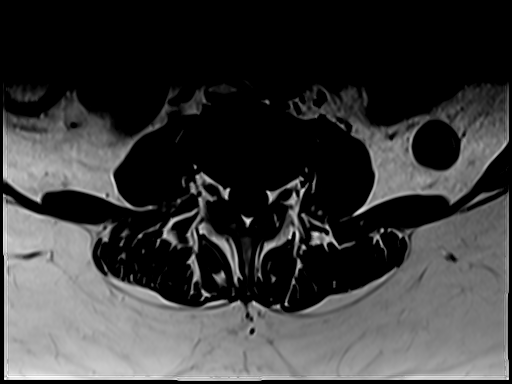
[im 19/28]
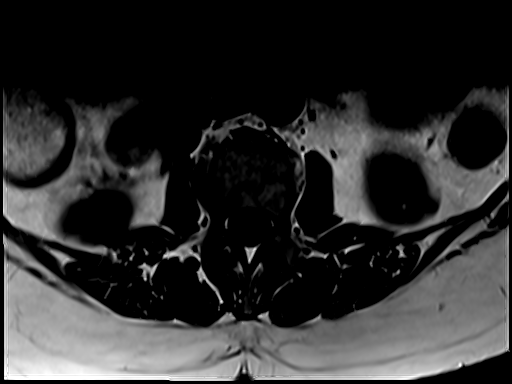
[im 28/28]
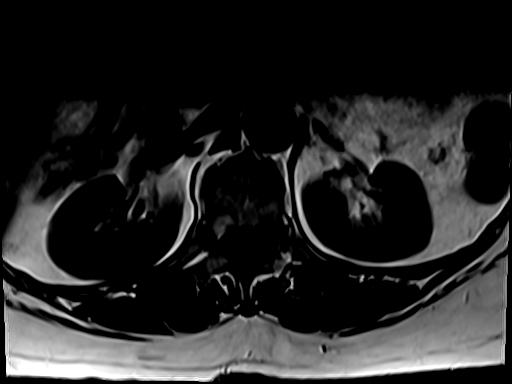

[Series 12: T1 · axial · 4.0mm · 0.43mm/px · 1 of 8 slices shown (3 of 4)]
[im 1/8]
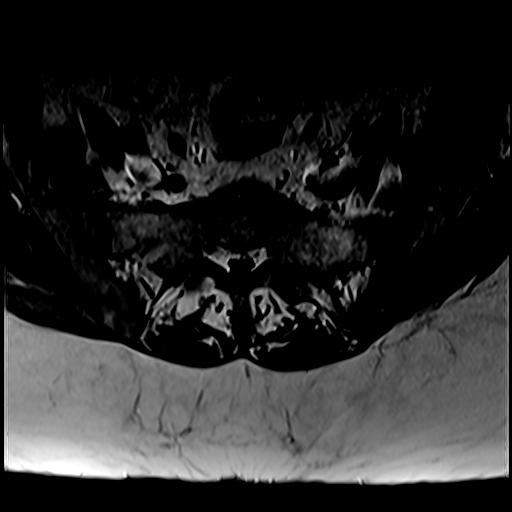

[Series 13: T1 · axial · 4.0mm · 0.43mm/px · z∈[-172,+59]mm · 5 of 34 slices shown (4 of 4)]
[im 1/34]
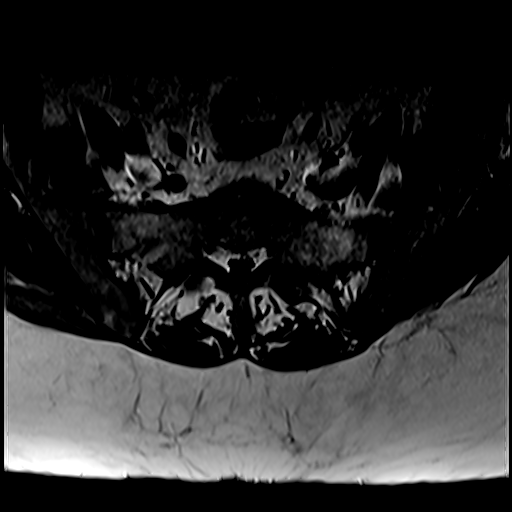
[im 9/34]
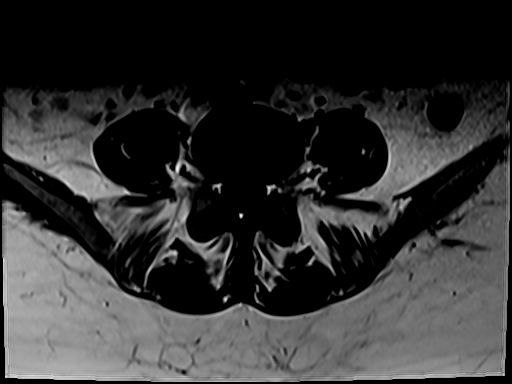
[im 17/34]
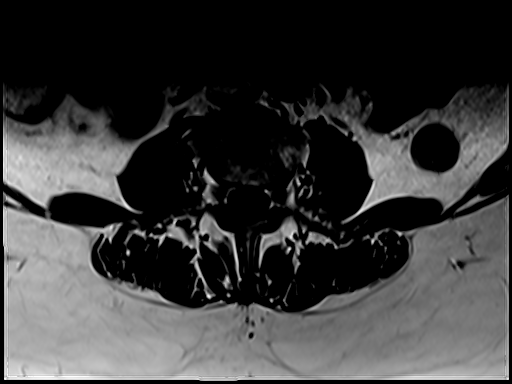
[im 25/34]
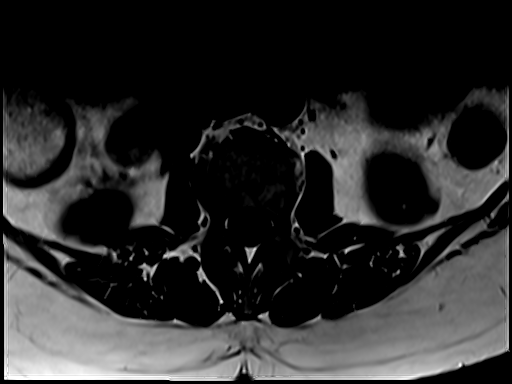
[im 34/34]
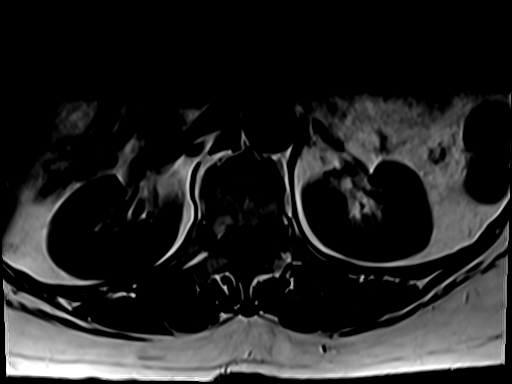

[Series 14: T2 · axial · 4.5mm · 0.81mm/px · z∈[-86,+59]mm · 4 of 28 slices shown (2 of 4)]
[im 1/28]
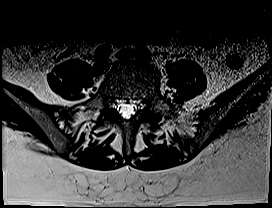
[im 10/28]
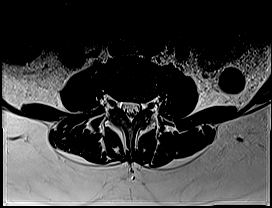
[im 19/28]
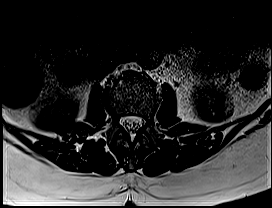
[im 28/28]
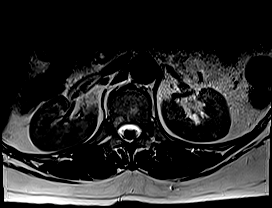

[Series 15: T2 · axial · 4.0mm · 0.81mm/px · 1 of 8 slices shown (3 of 4)]
[im 1/8]
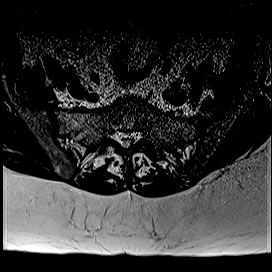

[Series 16: T2 · axial · 4.0mm · 0.81mm/px · z∈[-172,+59]mm · 5 of 34 slices shown (4 of 4)]
[im 1/34]
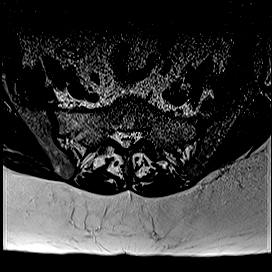
[im 9/34]
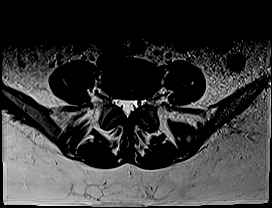
[im 17/34]
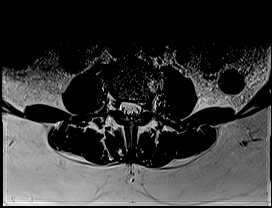
[im 25/34]
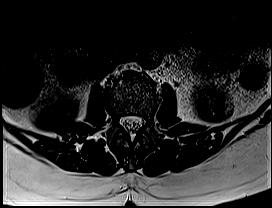
[im 34/34]
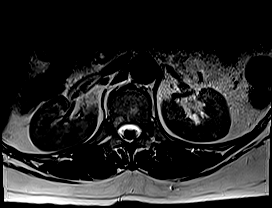

[29 of 48 positions shown; findings below may reference images not displayed]

DIAGNOSTIC STUDIES

EXAM

MRI of the lumbar spine without contrast.

INDICATION

low back pain
LOW BACK PAIN WITH RADIATION DOWN RIGHT LEG, NUMBNESS TO RIGHT LEG.  RG

TECHNIQUE

Sagittal axial images were obtained with variable T1 and T2 weighting.

COMPARISONS

None available

FINDINGS

Conus medullaris is normal in signal intensity and location. Multiple vertebral hemangiomas are
evident throughout the spine. T12-L1 disc is normal.

L1-2: Loss of height and disc bulging is seen with mild spurring of the vertebral endplates. Minimal
facet hypertrophy is evident. No central canal or neural foraminal stenosis is seen.

L2-3: There is loss of height and osteophytic spurring of the vertebral endplates. Bilateral facet
hypertrophy is evident. No central canal or neural foraminal stenosis.

L3-4: There is minimal disc bulging. No central canal or neural foraminal stenosis.

L4-5: Slight loss of disc signal is evident. No central canal or neural foraminal stenosis. Mild
facet hypertrophy is noted.

L5-S1: Minimal loss of disc signal is seen. Mild facet hypertrophy is noted. No central canal or
neural foraminal stenosis.

IMPRESSION

Mild degenerative changes throughout the lumbar spine without central canal or neural foraminal
stenosis.

Tech Notes:

LOW BACK PAIN WITH RADIATION DOWN RIGHT LEG, NUMBNESS TO RIGHT LEG.  RG

## 2022-06-05 ENCOUNTER — Encounter

## 2022-06-24 ENCOUNTER — Inpatient Hospital Stay: Admit: 2022-06-24 | Payer: MEDICARE | Attending: Critical Care Medicine | Primary: Family Medicine

## 2022-06-24 DIAGNOSIS — Z87891 Personal history of nicotine dependence: Secondary | ICD-10-CM

## 2022-07-17 ENCOUNTER — Encounter: Admit: 2022-07-17 | Discharge: 2022-07-17 | Payer: MEDICARE

## 2022-07-17 NOTE — Progress Notes
Venda Rodes, 11-22-1955 has an appointment with Dr. Murvin Natal on 07/22/22.    Please send recent lab results for continuity of care.    Thank you,   Ann Bohne    Phone: [phone]  Fax: [phone]

## 2022-07-18 ENCOUNTER — Encounter: Admit: 2022-07-18 | Discharge: 2022-07-18 | Payer: MEDICARE

## 2022-07-22 ENCOUNTER — Encounter
Admit: 2022-07-22 | Discharge: 2022-07-22 | Payer: MEDICARE | Primary: Student in an Organized Health Care Education/Training Program

## 2022-07-22 ENCOUNTER — Ambulatory Visit
Admit: 2022-07-22 | Discharge: 2022-07-23 | Payer: MEDICARE | Primary: Student in an Organized Health Care Education/Training Program

## 2022-07-22 DIAGNOSIS — R0609 Other forms of dyspnea: Secondary | ICD-10-CM

## 2022-07-22 DIAGNOSIS — R002 Palpitations: Secondary | ICD-10-CM

## 2022-07-22 NOTE — Progress Notes
Date of Service: 07/22/2022    Mikayla Ramirez is a 67 y.o. female.       HPI     Mikayla Ramirez is a very pleasant 67 year old patient with past medical history of palpitations.  Her cardiac workup was essentially negative for any significant abnormalities.  Her echocardiogram in May 2022 revealed normal left ventricular function with concentric left ventricular modeling.  Her stress test did not reveal any significant myocardial ischemia also in May 2022.  Her cardiac monitor in 2021 revealed occasional isolated supraventricular and ventricular extrasystoles with no significant ventricular or atrial arrhythmias.    Since the last time I saw her she has been having worsening shortness of breath.  Was noted to have low blood counts.  Needed iron transfusion.  After iron transfusion her last hemoglobin was 12.2.  However still continues to feel tired with little activity.  Some days are better than others.    Blood pressures are doing well.  Her twelve-lead EKG shows sinus rhythm with no significant ST-T changes.    Recommendations:  1.  Will schedule a follow-up echo Doppler to see if there is any interval change in LV function.  2.  Patient will continue to monitor her blood counts with her PCP.  3.  I have advised her to also follow her daily weights.  It is important to know how much she weighs on a daily basis and if she is putting on more weight.  From the last time she is lost close to about 10 pounds of weight.  4.  If echocardiogram looks good and the symptoms are stable we will see her in a year.  If echocardiogram is good and she continues to feel tired then I will schedule her also for a sleep study.    Total time spent with the patient was 30 minutes in the clinic.  This included reviewing her last echocardiogram, stress test as well as going over my recommendations including possible sleep study down the line if she continues to feel short of breath.           Vitals:    07/22/22 1636 07/22/22 1643   BP: 118/82 122/80   BP Source: Arm, Left Upper Arm, Right Upper   Pulse: 81    PainSc: Zero    Weight: 81.5 kg (179 lb 9.6 oz)    Height: 162.6 cm (5' 4)      Body mass index is 30.83 kg/m?Marland Kitchen     Past Medical History  There are no problems to display for this patient.        Review of Systems   Constitutional: Negative.   HENT: Negative.     Eyes: Negative.    Cardiovascular: Negative.    Respiratory: Negative.     Endocrine: Negative.    Hematologic/Lymphatic: Negative.    Skin: Negative.    Gastrointestinal: Negative.    Genitourinary: Negative.    Neurological: Negative.    Psychiatric/Behavioral: Negative.     Allergic/Immunologic: Negative.      Physical Exam  General Appearance: resting comfortably, no acute distress  Skin: warm, moist, no ulcers or xanthomas  Digits and Nails: no clubbing  Eyes: conjunctivae and lids normal  Neck Veins: neck veins are not distended  Thyroid: no nodules, masses, tenderness or enlargement  Chest Inspection: chest is normal in appearance  Respiratory Effort: breathing is unlabored, no respiratory distress  Auscultation/Percussion: lungs clear to auscultation, no rales, rhonchi, or wheezing  PMI: PMI not enlarged  or displaced  Cardiac Rhythm: regular rhythm and normal rate  Cardiac Auscultation: Normal S1 & S2, no S3 or S4, no rub  Murmurs: no cardiac murmurs   Carotid Arteries: normal carotid upstroke bilaterally, no bruits  Pedal Pulses: normal symmetric pedal pulses  Lower Extremity Edema: no lower extremity edema  Abdominal Exam: soft, non-tender, no masses, bowel sounds normal  Abdominal Aorta: nonpalpable abdominal aorta; no abdominal bruits  Liver & Spleen: no organomegaly  Neurologic Exam: neurological assessment grossly intact  Orientation: oriented to time, place and person  Affect & Mood: appropriate and sustained affect  Other: moves all extremities        Cardiovascular Studies  March 13, 2020, 48-hour Holter monitor: Patient had two 24-hour Holter monitors.  Basic underlying rhythm was sinus.  Mean heart rate noted is 66 bpm.  Minimum heart rate was noted at 46 bpm and maximum heart rate noted was 130 bpm.  Rare atrial ectopy was noted.  There were no significant atrial arrhythmias.  Rare premature ventricular beats were noted.  There were no couplets or any ventricular tachycardia.  There were no pauses noted.  Overall 48-hour monitor revealed isolated atrial and ventricular ectopy without any complex arrhythmias.     Oct 03, 2020 echocardiogram:  Normal LV size and function.  Estimated ejection fraction is 60-65 %.  Concentric remodeling is noted.  There is no resting wall motion abnormality  Normal RV size and function  The atria are normal in size  Normal valve morphology  Mild pulmonic insufficiency is seen  No pericardial effusion noted     Oct 03, 2020 stress echocardiogram:  Conclusion:   Negative ECG exercise stress test.  Decreased functional capacity for age with exaggerated heart rate response.  Negative exercise stress echo without evidence of inducible ischemia.  Overall, low risk for cardiovascular complications.    EKG today shows sinus rhythm.  Heart rate is 80 bpm.  No significant ST-T changes are noted.  Compared from previous electrocardiogram there are no significant changes.    Cardiovascular Health Factors  Vitals BP Readings from Last 3 Encounters:   07/22/22 122/80   07/05/21 118/78   10/03/20 138/86     Wt Readings from Last 3 Encounters:   07/22/22 81.5 kg (179 lb 9.6 oz)   07/05/21 86.7 kg (191 lb 1.6 oz)   10/03/20 85.5 kg (188 lb 8 oz)     BMI Readings from Last 3 Encounters:   07/22/22 30.83 kg/m?   07/05/21 32.80 kg/m?   10/03/20 32.36 kg/m?      Smoking Social History     Tobacco Use   Smoking Status Never   Smokeless Tobacco Never      Lipid Profile No results found for: CHOL  No results found for: HDL  No results found for: LDL  No results found for: TRIG   Blood Sugar No results found for: HGBA1C  Glucose   Date Value Ref Range Status   05/23/2021 118  Final   07/04/2020 93  Final   03/30/2020 93  Final          Problems Addressed Today  Encounter Diagnoses   Name Primary?    Palpitations Yes       Assessment and Plan     1.  Dyspnea on exertion and generalized fatigue.  Will recheck echocardiogram.  If echocardiogram is normal and her blood count Is to be normal may consider sleep study.  2.  Palpitations.  Seems to be rare.  3.  Anemia workup is being continued.  This is considered to be iron deficiency anemia.  She is following her PCP.  4.  Previous history of syncope.  Fortunately no recent episodes.         Current Medications (including today's revisions)   iron bis-glycinat/vit C/FA/B12 (GENTLE IRON PO) Take 28 mg by mouth daily.    magnesium oxide (MAGOX) 400 mg (241.3 mg magnesium) tablet Take one tablet by mouth daily as needed.    potassium chloride SR (K-DUR) 20 mEq tablet Take one tablet by mouth daily as needed.    traMADoL (ULTRAM) 50 mg tablet Take one tablet by mouth every 8 hours as needed.    vitamins, B complex tab Take one tablet by mouth every 48 hours as needed.

## 2022-07-22 NOTE — Patient Instructions
Follow-Up:    -Thank you for allowing [place] to participate in your care today. Your After Visit Summary is being completed by Marland Kitchen, RN.    -We would like you to follow up in  1 years with Myriam Jacobson, MD  -The schedule is released approximately 4-5 months in advance. You will be called by our scheduling department to make an appointment and you will also receive a notification via MyChart to self-schedule.  However, if you would like to call to make this appointment, please call [phone].    -Please schedule the following testing at check out, or by calling our scheduling line: ECHOCARDIOGRAM      Contacting our office:    -For NON-URGENT questions please contact us via message through your MyChart account.   -For all medication refills please contact your pharmacy or send a request through MyChart.     -For all questions that may need to be addressed urgently please call the [facility] nursing triage line at [phone] Monday - Friday 8am-5pm only. Please leave a detailed message with your name, date of birth, and reason for your call.  If your message is received before 3:30pm, every effort will be made to call you back the same day.  Please allow time for [place] to review your chart prior to call back.     -Should you have an urgent concern over the weekend/nights, the on-call triage line is [phone].    [facility]ail nursing team fax number: [phone]    -You may receive a survey in the upcoming weeks from [facility]. Your feedback is important to [place] and helps [place] continue to improve patient care and patient satisfaction.     -Please feel free to call our Financial Department at [phone] with any questions or concerns about estimated cost of testing or imaging ordered today. We are happy to provide CPT codes upon request.    Results & Testing Follow Up:    -Please allow 5-7 business days for the results of any testing to be reviewed. Please call our office if you have not heard from a nurse within this time frame.    -Should you choose to complete testing at an outside facility, please contact our office after completion of testing so that we can ensure that we have received results for your provider to review.    Lab and test results:  As a part of the CARES act, starting 08/11/2019, some results will be released to you via MyChart immediately and automatically.  You may see results before your provider sees them; however, your provider will review all these results and then they, or one of their team, will notify you of result information and recommendations.   Critical results will be addressed immediately, but otherwise, please allow [place] time to get back with you prior to you reaching out to [place] for questions.  This will usually take about 72 hours for labs and 5-7 days for procedure test results.

## 2022-09-22 ENCOUNTER — Encounter

## 2022-10-08 ENCOUNTER — Inpatient Hospital Stay: Admit: 2022-10-08 | Payer: MEDICARE | Primary: Family Medicine

## 2022-10-08 DIAGNOSIS — Z1231 Encounter for screening mammogram for malignant neoplasm of breast: Secondary | ICD-10-CM

## 2022-11-11 ENCOUNTER — Encounter
Admit: 2022-11-11 | Discharge: 2022-11-11 | Payer: MEDICARE | Primary: Student in an Organized Health Care Education/Training Program

## 2022-11-12 ENCOUNTER — Encounter
Admit: 2022-11-12 | Discharge: 2022-11-12 | Payer: MEDICARE | Primary: Student in an Organized Health Care Education/Training Program

## 2022-11-12 ENCOUNTER — Ambulatory Visit
Admit: 2022-11-12 | Discharge: 2022-11-12 | Payer: MEDICARE | Primary: Student in an Organized Health Care Education/Training Program

## 2022-11-12 DIAGNOSIS — R0609 Other forms of dyspnea: Secondary | ICD-10-CM

## 2022-12-02 ENCOUNTER — Encounter
Admit: 2022-12-02 | Discharge: 2022-12-02 | Payer: MEDICARE | Primary: Student in an Organized Health Care Education/Training Program

## 2023-05-22 ENCOUNTER — Inpatient Hospital Stay: Admit: 2023-05-22 | Discharge: 2023-05-23 | Disposition: A | Discharge status: Home / Self Care | Payer: MEDICARE

## 2023-05-22 ENCOUNTER — Emergency Department: Admit: 2023-05-22 | Payer: MEDICARE | Primary: Family Medicine

## 2023-05-22 DIAGNOSIS — N3000 Acute cystitis without hematuria: Secondary | ICD-10-CM

## 2023-05-22 LAB — COMPREHENSIVE METABOLIC PANEL
ALT: 22 U/L (ref 12–78)
AST: 19 U/L (ref 15–37)
Albumin/Globulin Ratio: 1.1 (ref 1.1–2.2)
Albumin: 3.7 g/dL (ref 3.5–5.0)
Alk Phosphatase: 68 U/L (ref 45–117)
Anion Gap: 3 mmol/L (ref 2–12)
BUN/Creatinine Ratio: 20 (ref 12–20)
BUN: 17 mg/dL (ref 6–20)
CO2: 32 mmol/L (ref 21–32)
Calcium: 9.5 mg/dL (ref 8.5–10.1)
Chloride: 103 mmol/L (ref 97–108)
Creatinine: 0.84 mg/dL (ref 0.55–1.02)
Est, Glom Filt Rate: 76 mL/min/{1.73_m2} (ref >60)
Globulin: 3.3 g/dL (ref 2.0–4.0)
Glucose: 102 mg/dL — ABNORMAL HIGH (ref 65–100)
Potassium: 4.5 mmol/L (ref 3.5–5.1)
Sodium: 138 mmol/L (ref 136–145)
Total Bilirubin: 0.5 mg/dL (ref 0.2–1.0)
Total Protein: 7 g/dL (ref 6.4–8.2)

## 2023-05-22 LAB — CBC WITH AUTO DIFFERENTIAL
Basophils %: 0.4 % (ref 0.0–1.0)
Basophils Absolute: 0.04 10*3/uL (ref 0.00–0.10)
Eosinophils %: 0.7 % (ref 0.0–7.0)
Eosinophils Absolute: 0.07 10*3/uL (ref 0.00–0.40)
Hematocrit: 41.7 % (ref 35.0–47.0)
Hemoglobin: 13.7 g/dL (ref 11.5–16.0)
Immature Granulocytes %: 0.9 % — ABNORMAL HIGH (ref 0.0–0.5)
Immature Granulocytes Absolute: 0.09 10*3/uL — ABNORMAL HIGH (ref 0.00–0.04)
Lymphocytes %: 9 % — ABNORMAL LOW (ref 12.0–49.0)
Lymphocytes Absolute: 0.86 10*3/uL (ref 0.80–3.50)
MCH: 29.2 pg (ref 26.0–34.0)
MCHC: 32.9 g/dL (ref 30.0–36.5)
MCV: 88.9 fL (ref 80.0–99.0)
MPV: 9.1 fL (ref 8.9–12.9)
Monocytes %: 4.5 % — ABNORMAL LOW (ref 5.0–13.0)
Monocytes Absolute: 0.43 10*3/uL (ref 0.00–1.00)
Neutrophils %: 84.5 % — ABNORMAL HIGH (ref 32.0–75.0)
Neutrophils Absolute: 8.09 10*3/uL — ABNORMAL HIGH (ref 1.80–8.00)
Nucleated RBCs: 0 /100{WBCs}
Platelets: 266 10*3/uL (ref 150–400)
RBC: 4.69 M/uL (ref 3.80–5.20)
RDW: 12.6 % (ref 11.5–14.5)
WBC: 9.6 10*3/uL (ref 3.6–11.0)
nRBC: 0 10*3/uL (ref 0.00–0.01)

## 2023-05-22 LAB — D-DIMER, QUANTITATIVE: D-Dimer, Quant: 0.28 mg{FEU}/L (ref 0.00–0.65)

## 2023-05-22 MED ORDER — IOPAMIDOL 76 % IV SOLN
76 | Freq: Once | INTRAVENOUS | Status: AC | PRN
Start: 2023-05-22 — End: 2023-05-22
  Administered 2023-05-22: 23:00:00 100 mL via INTRAVENOUS

## 2023-05-22 MED FILL — ISOVUE-370 76 % IV SOLN: 76 % | INTRAVENOUS | Qty: 100

## 2023-05-22 NOTE — ED Notes (Signed)
Patient to CT at this time

## 2023-05-22 NOTE — ED Provider Notes (Signed)
[facility]  EMERGENCY DEPARTMENT ENCOUNTER       Pt Name: Claudia [name]  MRN: [id number]  Birthdate 08/23/1955  Date of evaluation: 05/22/2023  Provider: Milton Ferguson, PA-C   PCP: Gilles Chiquito, DO  Note Started: 8:47 PM EST 05/22/23     CHIEF COMPLAINT       Chief Complaint   Patient presents with    Seizures     Reports dizzy and lightheadedness sat down, patient's granddaughter reports patient was "stiff and tense for 5 minutes and non-responsive", patient reports she remembers        HISTORY OF PRESENT ILLNESS: 1 or more elements      History From: Patient  HPI Limitations: None     Claudia [name] is a 68 y.o. female who presents to the emergency department today accompanied by her daughter.  Patient has a history of COPD, schizophrenia, states that she is ambulatory with walker at home normally.  Patient had a moment of vertiginous dizziness at home that lasted what she describes as less than 5 minutes.  This completely resolved.  It was not associated with any slurred speech no tingling no numbness no weakness.  She states that when she began feeling dizzy she sat down on her walker and states still and did not talk because she did not want to scare her granddaughter.  Family members witnessed the event and stated that the patient was staring straight ahead.  Patient states that she was alert and oriented and choosing not to respond throughout the event.  No rhythmic or repetitive motions were noted.  Patient adds that she feels generally weak after this.  She denies shortness of breath or chest pain, has not had any abdominal pain.  Patient denies headache, is no longer experiencing dizziness.  No visual deficit, no changes in speech per daughter.  No changes in gait per daughter.     Nursing Notes were all reviewed and agreed with or any disagreements were addressed in the HPI.     REVIEW OF SYSTEMS      Review of Systems     Positives and Pertinent negatives as per  HPI.    PAST HISTORY     Past Medical History:  Past Medical History:   Diagnosis Date    Arthritis     unknown type - back    CAD (coronary artery disease)      stent placed in 2006 [place]; followed by Dr Eugenie Filler    Cancer [facility])     colon ca    Chronic obstructive pulmonary disease (HCC)     managed by PCP    Hypercholesteremia     Menopause     MI (myocardial infarction) (HCC) 2006    in [place]    Psychiatric disorder     schizophrenia    Schizophrenia (HCC)     Dr Hubert Azure on [place] [place]       Past Surgical History:  Past Surgical History:   Procedure Laterality Date    COLONOSCOPY N/A 08/04/2017    COLONOSCOPY performed by Herbert Moors., MD at [facility]    COLONOSCOPY N/A 04/16/2021    COLONOSCOPY performed by Herbert Moors., MD at [facility]    COLONOSCOPY,REMV LESN,SNARE  08/04/2017         COLORECTAL SCRN; HI RISK IND  11/01/2013         COLORECTAL SCRN; HI RISK IND  08/04/2017  HEENT      teeth removal    PR UNLISTED PROCEDURE CARDIAC SURGERY  2006    stent placed       Family History:  Family History   Problem Relation Age of Onset    Cancer Brother         kidney    Breast Cancer Mother         50's    Cancer Mother         breast    Heart Attack Father        Social History:  Social History     Tobacco Use    Smoking status: Former     Current packs/day: 1.50     Types: Cigarettes    Smokeless tobacco: Current    Tobacco comments:     Quit smoking: vaping   Substance Use Topics    Alcohol use: Not Currently    Drug use: No       Allergies:  No Known Allergies    CURRENT MEDICATIONS      Discharge Medication List as of 05/22/2023  7:46 PM        CONTINUE these medications which have NOT CHANGED    Details   albuterol (PROVENTIL) (2.5 MG/3ML) 0.083% nebulizer solution Inhale 2.5 mg into the lungs in the morning and 2.5 mg at noon and 2.5 mg in the evening and 2.5 mg before bedtime.Historical Med      atorvastatin (LIPITOR) 40 MG tablet Take by mouthHistorical Med       benztropine (COGENTIN) 0.5 MG tablet Take 0.5 mg by mouthHistorical Med      vitamin D (CHOLECALCIFEROL) 25 MCG (1000 UT) TABS tablet Take by mouth dailyHistorical Med      citalopram (CELEXA) 40 MG tablet Take 40 mg by mouthHistorical Med      docusate (COLACE, DULCOLAX) 100 MG CAPS Take 100 mg by mouth 2 times dailyHistorical Med      ezetimibe (ZETIA) 10 MG tablet Take 5 mg by mouth dailyHistorical Med      lidocaine (LIDODERM) 5 % Apply patch to the affected area for 12 hours a day and remove for 12 hours a day.Historical Med      loratadine (CLARITIN) 10 MG tablet Take 10 mg by mouth dailyHistorical Med      nicotine polacrilex (COMMIT) 4 MG lozenge Place 4 mg inside cheek as neededHistorical Med      OLANZapine (ZYPREXA) 15 MG tablet Take 15 mg by mouthHistorical Med      promethazine-dextromethorphan (PROMETHAZINE-DM) 6.25-15 MG/5ML syrup Take 5 mLs by mouth every 4 hours as neededHistorical Med      tiotropium (SPIRIVA) 18 MCG inhalation capsule Inhale 1 capsule into the lungs dailyHistorical Med      traZODone (DESYREL) 100 MG tablet Take 100 mg by mouthHistorical Med             SCREENINGS               No data recorded        PHYSICAL EXAM      ED Triage Vitals [05/22/23 1509]   Encounter Vitals Group      BP 134/72      Systolic BP Percentile       Diastolic BP Percentile       Pulse 56      Respirations 14      Temp 98.3 F (36.8 C)      Temp Source Oral  SpO2 92 %      Weight - Scale 81.6 kg (180 lb)      Height 1.626 m (5\' 4" )      Head Circumference       Peak Flow       Pain Score       Pain Loc       Pain Education       Exclude from Growth Chart         Physical Exam  Constitutional:       Appearance: Normal appearance.   HENT:      Right Ear: Tympanic membrane, ear canal and external ear normal.      Left Ear: Tympanic membrane, ear canal and external ear normal.      Nose: Nose normal.      Mouth/Throat:      Mouth: Mucous membranes are moist.   Eyes:      Extraocular Movements:  Extraocular movements intact.      Pupils: Pupils are equal, round, and reactive to light.   Cardiovascular:      Rate and Rhythm: Normal rate and regular rhythm.      Pulses: Normal pulses.      Heart sounds: Normal heart sounds.   Pulmonary:      Effort: Pulmonary effort is normal.      Breath sounds: Normal breath sounds.   Abdominal:      General: Abdomen is flat.      Palpations: Abdomen is soft.   Musculoskeletal:         General: No swelling, tenderness, deformity or signs of injury. Normal range of motion.      Cervical back: Normal range of motion and neck supple.      Right lower leg: No edema.      Left lower leg: No edema.   Skin:     General: Skin is warm and dry.   Neurological:      General: No focal deficit present.      Mental Status: She is alert and oriented to person, place, and time. Mental status is at baseline.      Cranial Nerves: No cranial nerve deficit.      Sensory: No sensory deficit.      Motor: No weakness.      Coordination: Coordination normal.      Gait: Gait normal.      Deep Tendon Reflexes: Reflexes normal.      Comments: 5 out of 5 strength in all extremities, no focal neurodeficit   Psychiatric:         Mood and Affect: Mood normal.         Behavior: Behavior normal.         Judgment: Judgment normal.          DIAGNOSTIC RESULTS   LABS:     Recent Results (from the past 24 hour(s))   EKG 12 Lead    Collection Time: 05/22/23  3:22 PM   Result Value Ref Range    Ventricular Rate 53 BPM    Atrial Rate 53 BPM    P-R Interval 124 ms    QRS Duration 66 ms    Q-T Interval 424 ms    QTc Calculation (Bazett) 397 ms    P Axis 94 degrees    R Axis -68 degrees    T Axis 30 degrees    Diagnosis       Sinus bradycardia  Left axis deviation  Nonspecific  ST abnormality  When compared with ECG of 18-Mar-2021 07:33,  Vent. rate has decreased BY  28 BPM  Nonspecific T wave abnormality now evident in Inferior leads  Nonspecific T wave abnormality no longer evident in Lateral leads     CBC with  Auto Differential    Collection Time: 05/22/23  3:38 PM   Result Value Ref Range    WBC 9.6 3.6 - 11.0 K/uL    RBC 4.69 3.80 - 5.20 M/uL    Hemoglobin 13.7 11.5 - 16.0 g/dL    Hematocrit 19.1 47.8 - 47.0 %    MCV 88.9 80.0 - 99.0 FL    MCH 29.2 26.0 - 34.0 PG    MCHC 32.9 30.0 - 36.5 g/dL    RDW 29.5 62.1 - 30.8 %    Platelets 266 150 - 400 K/uL    MPV 9.1 8.9 - 12.9 FL    Nucleated RBCs 0.0 0 PER 100 WBC    nRBC 0.00 0.00 - 0.01 K/uL    Neutrophils % 84.5 (H) 32.0 - 75.0 %    Lymphocytes % 9.0 (L) 12.0 - 49.0 %    Monocytes % 4.5 (L) 5.0 - 13.0 %    Eosinophils % 0.7 0.0 - 7.0 %    Basophils % 0.4 0.0 - 1.0 %    Immature Granulocytes % 0.9 (H) 0.0 - 0.5 %    Neutrophils Absolute 8.09 (H) 1.80 - 8.00 K/UL    Lymphocytes Absolute 0.86 0.80 - 3.50 K/UL    Monocytes Absolute 0.43 0.00 - 1.00 K/UL    Eosinophils Absolute 0.07 0.00 - 0.40 K/UL    Basophils Absolute 0.04 0.00 - 0.10 K/UL    Immature Granulocytes Absolute 0.09 (H) 0.00 - 0.04 K/UL    Differential Type AUTOMATED     Comprehensive Metabolic Panel    Collection Time: 05/22/23  3:38 PM   Result Value Ref Range    Sodium 138 136 - 145 mmol/L    Potassium 4.5 3.5 - 5.1 mmol/L    Chloride 103 97 - 108 mmol/L    CO2 32 21 - 32 mmol/L    Anion Gap 3 2 - 12 mmol/L    Glucose 102 (H) 65 - 100 mg/dL    BUN 17 6 - 20 MG/DL    Creatinine 6.57 8.46 - 1.02 MG/DL    BUN/Creatinine Ratio 20 12 - 20      Est, Glom Filt Rate 76 >60 ml/min/1.51m2    Calcium 9.5 8.5 - 10.1 MG/DL    Total Bilirubin 0.5 0.2 - 1.0 MG/DL    ALT 22 12 - 78 U/L    AST 19 15 - 37 U/L    Alk Phosphatase 68 45 - 117 U/L    Total Protein 7.0 6.4 - 8.2 g/dL    Albumin 3.7 3.5 - 5.0 g/dL    Globulin 3.3 2.0 - 4.0 g/dL    Albumin/Globulin Ratio 1.1 1.1 - 2.2     D-Dimer, Quantitative    Collection Time: 05/22/23  3:38 PM   Result Value Ref Range    D-Dimer, Quant 0.28 0.00 - 0.65 mg/L FEU   Troponin    Collection Time: 05/22/23  3:38 PM   Result Value Ref Range    Troponin, High Sensitivity 34 0 - 51 ng/L    Urinalysis with Reflex to Culture    Collection Time: 05/22/23  6:46 PM    Specimen: Urine   Result Value Ref Range  Color, UA YELLOW/STRAW      Appearance CLEAR CLEAR      Specific Gravity, UA <1.005 1.003 - 1.030    pH, Urine 6.0 5.0 - 8.0      Protein, UA Negative NEG mg/dL    Glucose, Ur Negative NEG mg/dL    Ketones, Urine TRACE (A) NEG mg/dL    Bilirubin, Urine Negative NEG      Blood, Urine Negative NEG      Urobilinogen, Urine 0.2 0.2 - 1.0 EU/dL    Nitrite, Urine Positive (A) NEG      Leukocyte Esterase, Urine TRACE (A) NEG      WBC, UA 5-10 0 - 4 /hpf    RBC, UA 0-5 0 - 5 /hpf    Epithelial Cells, UA MODERATE (A) FEW /lpf    BACTERIA, URINE 2+ (A) NEG /hpf    Urine Culture if Indicated CULTURE NOT INDICATED BY UA RESULT CNI      Granular Casts, UA 0-2 (A) NEG /lpf         EKG: When ordered, EKG's are interpreted by the Emergency Department Physician in the absence of a cardiologist.  Please see their note for interpretation of EKG.      RADIOLOGY:  Non-plain film images such as CT, Ultrasound and MRI are read by the radiologist. Plain radiographic images are visualized and preliminarily interpreted by the ED Provider with the below findings:       Interpretation per the Radiologist below, if available at the time of this note:     CTA HEAD NECK W WO CONTRAST    Result Date: 05/22/2023  CLINICAL HISTORY: neuro sxs 11 am INDICATION: Seizures, lightheaded COMPARISON: None CONTRAST: 100 ml Isovue-370 TECHNIQUE:  Unenhanced scout images were obtained to localize the volume for acquisition. Multi slice helical axial CT angiography was performed from the aortic arch to the top of the head during uneventful rapid bolus intravenous contrast administration.   Coronal and sagittal reformations and 3D post processing was performed.  CT dose reduction was achieved through use of a standardized protocol tailored for this examination and automatic exposure control for dose modulation. Examination was evaluated  utilizing the viz. AI algorithm. FINDINGS: CTA NECK There is moderate centrilobular emphysema. Aortic atherosclerotic change. Left vertebral artery arises directly from the aortic arch. The right vertebral artery is dominant. Mild stenosis at the origin of the left vertebral artery.. Atherosclerotic change at the origin of the ICAs on the right and on the left.Marland Kitchen % of right carotid artery stenosis: 50-70 % of left carotid artery stenosis: 80-90 Measurements utilizing NASCET criteria. NASCET method was utilized for calculating stenosis. The left cervical ICA is diminutive in size.. The cervical soft tissues are unremarkable. Multilevel severe canal and foraminal stenosis of the cervical spine.. CTA HEAD Diminished size of the left petrous and cavernous ICA. M1 segments are patent. A1 segments are present bilaterally, hypoplastic on the left. Petrous and cavernous carotid arteries are patent.M1 segments are patent. Symmetric arborization of M2 vessels is demonstrated. Right vertebral artery is dominant. Basilar artery is patent. The proximal P1 segments are patent bilaterally.There is no aneurysm. There are no sizable posterior communicating arteries. No evidence of acute intracranial hemorrhage or midline shift is demonstrated.     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 No major vessel occlusion. Mild to moderate stenosis of the proximal right ICA. Severe multilevel canal and foraminal  stenosis of the cervical spine. Left vertebral artery arises directly from the aortic arch. There is no acute intracranial process. Electronically signed by Zenaida Niece HABIB      PROCEDURES   Unless otherwise noted below, none  Procedures     CRITICAL CARE TIME   Patient does not meet Critical Care Time, 0 minutes     EMERGENCY DEPARTMENT COURSE and DIFFERENTIAL DIAGNOSIS/MDM   Vitals:    Vitals:    05/22/23 1509 05/22/23 1600 05/22/23 1645 05/22/23  1800   BP: 134/72 108/87 123/64 124/62   Pulse: 56 57 53 55   Resp: 14 14 14 15    Temp: 98.3 F (36.8 C)      TempSrc: Oral      SpO2: 92% 90% 90% 91%   Weight: 81.6 kg (180 lb)      Height: 1.626 m (5\' 4" )           Patient was given the following medications:  Medications   iopamidol (ISOVUE-370) 76 % injection 100 mL (100 mLs IntraVENous Given 05/22/23 1736)   cefTRIAXone (ROCEPHIN) 1,000 mg in sterile water 10 mL IV syringe (1,000 mg IntraVENous Given 05/22/23 1955)       CONSULTS: (Who and What was discussed)  None    Chronic Conditions:  has a past medical history of Arthritis, CAD (coronary artery disease), Cancer (HCC), Chronic obstructive pulmonary disease (HCC), Hypercholesteremia, Menopause, MI (myocardial infarction) (HCC), Psychiatric disorder, and Schizophrenia (HCC).     Social Determinants affecting Dx or Tx: None    Records Reviewed (source and summary of external records): Nursing Notes    CC/HPI Summary, DDx, ED Course, and Reassessment: Patient is a well-appearing 68 year old female no acute distress presenting today with generalized weakness complaint after what her family describes as staring off into space.  Patient has had no focal neurodeficit, did not experience any weakness or paresthesia or slurred speech or change in gait, therefore I do not suspect STI.  I did perform CTA of the head and neck to evaluate for vertebral insufficiency and this was negative.  Patient's dizziness lightheadedness had completely resolved at time of reevaluation after approximately 2 hours here.  Lab work was relatively normal.  We did add on a urinalysis which shows that the patient does have a urinary tract infection.  This is most likely cause of her generalized weakness and we will treat this today.  Other differentials include seizure, which is also unlikely given lack of history and no red repetitive or rhythmic movements.  Patient also has intact tongue, no loss of continence to support this  diagnosis.  CTA is negative today, no vertebral insufficiency.  Dimer is negative making PE unlikely.  Lung sounds are clear bilaterally patient has no increased work of breathing.  SpO2 was noted to be 94% on room air in the room without oxygen.  Patient states that this is normal for her.  She is discharged in good condition, ambulating normally per daughter.  I also reviewed patient's workup today with her son over the phone with her permission    CLINICAL MANAGEMENT TOOLS:      Heart Score: 0  NIHSS:0           FINAL IMPRESSION     1. Acute cystitis without hematuria    2. Generalized weakness          DISPOSITION/PLAN   DISPOSITION Decision To Discharge 05/22/2023 07:41:55 PM   DISPOSITION CONDITION Stable  Discharge Note: The patient is stable for discharge home. The signs, symptoms, diagnosis, and discharge instructions have been discussed, understanding conveyed, and agreed upon. The patient is to follow up as recommended or return to ER should their symptoms worsen.      PATIENT REFERRED TO:  [facility] Emergency Department  [address]  [place] [address]  [phone]    As needed, If symptoms worsen    Gilles Chiquito, DO  [address]  [place] [address]  [phone]      repeat urinalysis       DISCHARGE MEDICATIONS:     Medication List        START taking these medications      cephALEXin 500 MG capsule  Commonly known as: Keflex  Take 1 capsule by mouth 2 times daily for 7 days            ASK your doctor about these medications      albuterol (2.5 MG/3ML) 0.083% nebulizer solution  Commonly known as: PROVENTIL     atorvastatin 40 MG tablet  Commonly known as: LIPITOR     benztropine 0.5 MG tablet  Commonly known as: COGENTIN     citalopram 40 MG tablet  Commonly known as: CELEXA     docusate 100 MG Caps  Commonly known as: COLACE, DULCOLAX     ezetimibe 10 MG tablet  Commonly known as: ZETIA     lidocaine 5 %  Commonly known as: LIDODERM     loratadine 10 MG  tablet  Commonly known as: CLARITIN     nicotine polacrilex 4 MG lozenge  Commonly known as: COMMIT     OLANZapine 15 MG tablet  Commonly known as: ZYPREXA     promethazine-dextromethorphan 6.25-15 MG/5ML syrup  Commonly known as: PROMETHAZINE-DM     tiotropium 18 MCG inhalation capsule  Commonly known as: SPIRIVA     traZODone 100 MG tablet  Commonly known as: DESYREL     vitamin D 25 MCG (1000 UT) Tabs tablet  Commonly known as: CHOLECALCIFEROL               Where to Get Your Medications        These medications [facility] sent to [facility] [address], [place] - [address] - P [phone] - F [phone]  [address] [place], [place] [address]      Hours: 24-hours Phone: [phone]   cephALEXin 500 MG capsule           DISCONTINUED MEDICATIONS:  Discharge Medication List as of 05/22/2023  7:46 PM        I have seen and evaluated the patient autonomously. My supervision physician was on site and available for consultation if needed.     I am the Primary Clinician of Record.   Milton Ferguson, PA-C (electronically signed)    (Please note that parts of this dictation were completed with voice recognition software. Quite often unanticipated grammatical, syntax, homophones, and other interpretive errors are inadvertently transcribed by the computer software. Please disregards these errors. Please excuse any errors that have escaped final proofreading.)      Milton Ferguson, PA-C  05/22/23 2054

## 2023-05-22 NOTE — ED Notes (Signed)
Discharge instructions have been discussed with patient. Opportunity for questions and further education has been provided to patient. Patient expressed understanding. Patient  understand to seek Emergency Medical attention for chest pain or new or worsening symptoms.

## 2023-05-22 NOTE — ED Triage Notes (Addendum)
Patient arrive via EMS from home with complaint of "seizure like behavior". Patient reportedly became lightheaded and dizzy at home sat down in a chair, patient's granddaughter reported to EMS that patient "sat still and non-responsive for 5 minutes". Patient reports to EMS that she remembers episode and was still because she "didn't want to scare her granddaughter". Patient denies feeling lightheaded or dizzy upon arrival.

## 2023-05-22 NOTE — ED Notes (Signed)
Provided patient with diet Pepsi and vanilla pudding at this time

## 2023-05-23 LAB — EKG 12-LEAD
Atrial Rate: 53 {beats}/min
P Axis: 94 degrees
P-R Interval: 124 ms
Q-T Interval: 424 ms
QRS Duration: 66 ms
QTc Calculation (Bazett): 397 ms
R Axis: -68 degrees
T Axis: 30 degrees
Ventricular Rate: 53 {beats}/min

## 2023-05-23 LAB — URINALYSIS WITH REFLEX TO CULTURE
Bilirubin, Urine: NEGATIVE
Blood, Urine: NEGATIVE
Glucose, Ur: NEGATIVE mg/dL
Nitrite, Urine: POSITIVE — AB
Protein, UA: NEGATIVE mg/dL
Specific Gravity, UA: <1.005 (ref 1.003–1.030)
Urobilinogen, Urine: 0.2 U/dL (ref 0.2–1.0)
pH, Urine: 6 (ref 5.0–8.0)

## 2023-05-23 LAB — TROPONIN: Troponin, High Sensitivity: 34 ng/L (ref 0–51)

## 2023-05-23 MED ORDER — CEFTRIAXONE SODIUM 1 G IJ SOLR
1 | INTRAMUSCULAR | Status: AC
Start: 2023-05-23 — End: 2023-05-22
  Administered 2023-05-23: 01:00:00 1000 mg via INTRAVENOUS

## 2023-05-23 MED ORDER — CEPHALEXIN 500 MG PO CAPS
500 | ORAL_CAPSULE | Freq: Two times a day (BID) | ORAL | 0 refills | Status: AC
Start: 2023-05-23 — End: 2023-05-29

## 2023-05-23 MED FILL — CEFTRIAXONE SODIUM 1 G IJ SOLR: 1 g | INTRAMUSCULAR | Qty: 1000

## 2023-09-03 ENCOUNTER — Inpatient Hospital Stay: Payer: Medicare (Managed Care) | Attending: Gastroenterology

## 2023-09-03 MED ORDER — PROPOFOL 100 MG/10ML IV EMUL
100 | Freq: Once | INTRAVENOUS | Status: DC | PRN
Start: 2023-09-03 — End: 2023-09-03
  Administered 2023-09-03: 19:00:00 50 via INTRAVENOUS
  Administered 2023-09-03: 18:00:00 100 via INTRAVENOUS

## 2023-09-03 MED ORDER — SODIUM CHLORIDE 0.9 % IV BOLUS
0.9 | Freq: Once | INTRAVENOUS | Status: DC | PRN
Start: 2023-09-03 — End: 2023-09-03
  Administered 2023-09-03: 19:00:00 250 via INTRAVENOUS

## 2023-09-03 MED ORDER — SODIUM CHLORIDE 0.9 % IV SOLN
0.9 | INTRAVENOUS | Status: DC | PRN
Start: 2023-09-03 — End: 2023-09-03
  Administered 2023-09-03: 18:00:00 via INTRAVENOUS

## 2023-09-03 MED ORDER — LIDOCAINE HCL 2 % IJ SOLN (MIXTURES ONLY)
2 | Freq: Once | INTRAMUSCULAR | Status: DC | PRN
Start: 2023-09-03 — End: 2023-09-03
  Administered 2023-09-03: 18:00:00 100 via INTRAVENOUS

## 2023-09-03 MED ORDER — NORMAL SALINE FLUSH 0.9 % IV SOLN
0.9 | Freq: Two times a day (BID) | INTRAVENOUS | Status: DC
Start: 2023-09-03 — End: 2023-09-03

## 2023-09-03 MED ORDER — NORMAL SALINE FLUSH 0.9 % IV SOLN
0.9 | INTRAVENOUS | Status: DC | PRN
Start: 2023-09-03 — End: 2023-09-03

## 2023-09-03 NOTE — Progress Notes (Signed)
 Endoscopy Case End Note:    1440:  Procedure scope was pre-cleaned, per protocol, at bedside by Chrystal L., RN .      1440:  Report received from anesthesia Myrtie Atkinson, CRNA.  See anesthesia flowsheet for intra-procedure vital signs and events.

## 2023-09-03 NOTE — Op Note (Signed)
 [facility]                  Colonoscopy Operative Report    09/03/2023      Claudia [name]  [id number]  Jan 22, 1956    Procedure Type:   Colonoscopy --screening     Indications:    Personal history of colon polyps (screening only), Screening colonoscopy     Pre-operative Diagnosis: see indication above    Post-operative Diagnosis:  See findings below    Operator:  Dedrick Fanti, MD    Referring Provider: Charlie Constant, DO      Sedation:  MAC anesthesia Propofol    Pre-Procedural Exam:      Airway: clear,  No airway problems anticipated  Heart: RRR, without gallops or rubs  Lungs: clear bilaterally without wheezes, crackles, or rhonchi  Abdomen: soft, nontender, nondistended, bowel sounds present  Mental Status: awake, alert and oriented to person, place and time     Procedure Details:  After informed consent was obtained with all risks and benefits of procedure explained and preoperative exam completed, the patient was taken to the endoscopy suite and placed in the left lateral decubitus position.  Upon sequential sedation as per above, a digital rectal exam was performed .  The Olympus videocolonoscope  was inserted in the rectum and carefully advanced to the cecum, which was identified by the ileocecal valve and appendiceal orifice.  The cecum was identified by the ileocecal valve and appendiceal orifice.  The quality of preparation was good.  The colonoscope was slowly withdrawn with careful evaluation between folds. Retroflexion in the rectum was completed demonstrating internal hemorrhoids.     Findings:   Rectum: Grade 1 internal hemorrhoid(s);  Sigmoid: surgically absent  Descending Colon: normal  Transverse Colon: normal  Ascending Colon: normal  Cecum: normal  Terminal Ileum: not intubated      Specimen Removed:  none    Complications: None.     EBL:  None.    Impression:    normal colonic mucosa throughout  hemorrhoids internal, Moderate in size    Recommendations:  --Repeat colonoscopy in 5 years. High fiber diet.  Resume normal medication(s).     Discharge Disposition:  Home in the company of a driver when able to ambulate.    Laurinda Porch, MD    09/03/2023     P. April Bayard, MD  [facility]  [address]  [place], [place] [address]  [phone]  [email or web address]

## 2023-09-03 NOTE — Discharge Instructions (Addendum)
 Baldo Bonds, MD  [facility]  [address]  [place], [place] [address]  [phone]  [email or web address]    Claudia [name]  [id number]  December 28, 1955    COLON DISCHARGE INSTRUCTIONS  Discomfort:  Redness at IV site- apply warm compress to area; if redness or soreness persist- contact your physician  There may be a slight amount of blood passed from the rectum  Gaseous discomfort- walking, belching will help relieve any discomfort  You may not operate a vehicle for 12 hours  You may not engage in an occupation involving machinery or appliances for rest of today  You may not drink alcoholic beverages for at least 12 hours  Avoid making any critical decisions for at least 24 hour  DIET:   High fiber diet.   - however -  remember your colon is empty and a heavy meal will produce gas.   Avoid these foods:  vegetables, fried / greasy foods, carbonated drinks for today      ACTIVITY:  You may resume your normal daily activities it is recommended that you spend the remainder of the day resting -  avoid any strenuous activity.    CALL M.D.  ANY SIGN OF:   Increasing pain, nausea, vomiting  Abdominal distension (swelling)  New increased bleeding (oral or rectal)  Fever (chills)  Pain in chest area  Bloody discharge from nose or mouth  Shortness of breath     COLONOSCOPY FINDINGS:  Your colonoscopy showed: NO polyps found. So can have next colonoscopy in 5 years.    Follow-up Instructions:   Call Dr. Baldo Bonds if any questions or problems.   Telephone # [phone]  Should have a repeat colonoscopy in 5 years.    Patient Education on Sedation / Analgesia Administered for Procedure      For 24 hours after general anesthesia or intravenous analgesia / sedation:  Have someone responsible help you with your care  Limit your activities  Do not drive and operate hazardous machinery  Do not make important personal, legal or business decisions  Do not drink  alcoholic beverages  If you have not urinated within 8 hours after discharge, please contact your physician  Resume your medications unless otherwise instructed    For 24 hours after general anesthesia or intravenous analgesia / sedation  you may experience:  Drowsiness, dizziness, sleepiness, or confusion  Difficulty remembering or delayed reaction times  Difficulty with your balance, especially while walking, move slowly and carefully, do not make sudden position changes  Difficulty focusing or blurred vision    You may not be aware of slight changes in your behavior and/or your reaction time because of the medication used during and after your procedure.    Report the following to your physician:  Excessive pain, swelling, redness or odor of or around the surgical area  Temperature over 100.5  Nausea and vomiting lasting longer than 4 hours or if unable to take medications  Any signs of decreased circulation or nerve impairment to extremity: change in color, persistent numbness, tingling, coldness or increase pain  Any questions or concerns    IF YOU REPORT TO AN EMERGENCY ROOM, DOCTOR'S OFFICE OR HOSPITAL WITHIN 24 HOURS AFTER YOUR PROCEDURE, BRING THIS SHEET AND YOUR AFTER VISIT SUMMARY WITH YOU AND GIVE IT TO THE PHYSICIAN OR NURSE ATTENDING YOU.

## 2023-09-03 NOTE — Anesthesia Post-Procedure Evaluation (Signed)
 Department of Anesthesiology  Postprocedure Note    Patient: Claudia [name]  MRN: [id number]  Birthdate: 01-Aug-1955  Date of evaluation: 09/04/2023    Procedure Summary       Date: 09/03/23 Room / Location: [facility] / [facility]    Anesthesia Start: 1425 Anesthesia Stop: 1440    Procedure: COLONOSCOPY DIAGNOSTIC Diagnosis:       Colon cancer screening      (Colon cancer screening [Z12.11])    Surgeons: Scot Cutter., MD Responsible Provider: Geralene Knack, MD    Anesthesia Type: MAC ASA Status: 3            Anesthesia Type: MAC    Aldrete Phase I: Aldrete Score: 10    Aldrete Phase II: Aldrete Score: 10    Anesthesia Post Evaluation    Patient location during evaluation: PACU  Patient participation: complete - patient participated  Level of consciousness: awake and alert  Airway patency: patent  Nausea & Vomiting: no nausea  Cardiovascular status: hemodynamically stable  Respiratory status: acceptable  Hydration status: euvolemic        No notable events documented.

## 2023-09-03 NOTE — Progress Notes (Signed)
 Endoscopy recovery  Patient returned to baseline, vital signs stable (see vital sign flowsheet). Patient offered liquids and tolerated well. Respiratory status within defined limits. Abdomen soft not tender. Skin with in defined limits. Responsible party driving patient home was given the opportunity to ask questions. Patient discharged with documented belongings.

## 2023-09-03 NOTE — H&P (Signed)
 Baldo Bonds, MD  [facility]  [address], [address]  [place], [place] [address]  [phone]  [email or web address]    Gastroenterology Outpatient History and Physical    Patient: Claudia [name]    Physician: Laurinda Porch, MD    Vital Signs: Blood pressure 122/76, pulse 100, resp. rate 14, height 1.626 m (5\' 4" ), weight 73.1 kg (161 lb 1.6 oz), SpO2 95%.    Allergies: No Known Allergies    Chief Complaint: Hx of polyps    History of Present Illness: Personal history of colonic polyps. Last colonoscopy was 2022 and showed multiple colonic polyps. Currently has no GI symptoms. No FH of colon cancer or polyps.      History:  Past Medical History:   Diagnosis Date    Arthritis     unknown type - back    CAD (coronary artery disease)      stent placed in 2006 [place] ; followed by Dr Darrell Else    Cancer [facility])     colon ca    Chronic obstructive pulmonary disease (HCC)     managed by PCP    Hypercholesteremia     Menopause     MI (myocardial infarction) (HCC) 2006    in [place]     Psychiatric disorder     schizophrenia    Schizophrenia (HCC)     Dr Hessie Loss on [address]      Past Surgical History:   Procedure Laterality Date    COLONOSCOPY N/A 08/04/2017    COLONOSCOPY performed by Scot Cutter., MD at [facility]    COLONOSCOPY N/A 04/16/2021    COLONOSCOPY performed by Scot Cutter., MD at [facility]    COLONOSCOPY,REMV LESN,SNARE  08/04/2017         COLORECTAL SCRN; HI RISK IND  11/01/2013         COLORECTAL SCRN; HI RISK IND  08/04/2017         HEENT      teeth removal    PR UNLISTED PROCEDURE CARDIAC SURGERY  2006    stent placed      Social History     Socioeconomic History    Marital status: Widowed     Spouse name: None    Number of children: None    Years of education: 10    Highest education level: None   Tobacco Use    Smoking status: Former     Current packs/day: 1.50     Types: Cigarettes    Smokeless tobacco: Current     Tobacco comments:     Quit smoking: vaping   Substance and Sexual Activity    Alcohol use: Not Currently    Drug use: No     Social Drivers of Psychologist, prison and probation services Strain: High Risk (02/14/2019)    [facility] from [facility]  (prior to 10/26/2021), [facility]  (prior to 10/26/2021)    Overall Financial Resource Strain (CARDIA)     Difficulty of Paying Living Expenses: Very hard   Food Insecurity: No Food Insecurity (02/14/2019)    [facility] from [facility]  (prior to 10/26/2021), [facility]  (prior to 10/26/2021)    Hunger Vital Sign     Worried About Running Out of Food in the Last Year: Never true     Ran Out of Food in the Last Year: Never true   Transportation  Needs: Unmet Transportation Needs (02/14/2019)    Received from [facility]  (prior to 10/26/2021), [facility]  (prior to 10/26/2021)    PRAPARE - Transportation     Lack of Transportation (Medical): Yes     Lack of Transportation (Non-Medical): Yes   Physical Activity: Unknown (02/14/2019)    Received from [facility]  (prior to 10/26/2021), [facility]  (prior to 10/26/2021)    Exercise Vital Sign     Days of Exercise per Week: 0 days   Stress: Stress Concern Present (02/14/2019)    Received from [facility]  (prior to 10/26/2021), [facility]  (prior to 10/26/2021)    [employer] of Occupational Health - Occupational Stress Questionnaire     Feeling of Stress : To some extent   Social Connections: Unknown (02/14/2019)    Received from [facility]  (prior to 10/26/2021), [facility]  (prior to 10/26/2021)    Social Connection and Isolation Panel [NHANES]     Frequency of Communication with Friends and Family: Twice a week     Frequency of Social Gatherings with Friends and Family: Twice a week     Attends Religious Services: Never     Database administrator or  Organizations: No     Attends Banker Meetings: Never      Family History   Problem Relation Age of Onset    Cancer Brother         kidney    Breast Cancer Mother         54's    Cancer Mother         breast    Heart Attack Father       Patient Active Problem List   Diagnosis    Unstable angina (HCC)    Colon cancer (HCC)       Medications:   Prior to Admission medications    Medication Sig Start Date End Date Taking? Authorizing Provider   albuterol (PROVENTIL) (2.5 MG/3ML) 0.083% nebulizer solution Inhale 3 mLs into the lungs every 6 hours   Yes Automatic Reconciliation, Ar   atorvastatin (LIPITOR) 40 MG tablet Take by mouth   Yes Automatic Reconciliation, Ar   benztropine (COGENTIN) 0.5 MG tablet Take 1 tablet by mouth 06/25/09  Yes Automatic Reconciliation, Ar   vitamin D (CHOLECALCIFEROL) 25 MCG (1000 UT) TABS tablet Take by mouth daily   Yes Automatic Reconciliation, Ar   citalopram (CELEXA) 40 MG tablet Take 1 tablet by mouth   Yes Automatic Reconciliation, Ar   docusate (COLACE, DULCOLAX) 100 MG CAPS Take 100 mg by mouth 2 times daily 12/23/12  Yes Automatic Reconciliation, Ar   ezetimibe (ZETIA) 10 MG tablet Take 0.5 tablets by mouth daily   Yes Automatic Reconciliation, Ar   loratadine (CLARITIN) 10 MG tablet Take 1 tablet by mouth daily   Yes Automatic Reconciliation, Ar   nicotine polacrilex (COMMIT) 4 MG lozenge Place 1 lozenge inside cheek as needed   Yes Automatic Reconciliation, Ar   OLANZapine (ZYPREXA) 15 MG tablet Take 1 tablet by mouth   Yes Automatic Reconciliation, Ar   promethazine-dextromethorphan (PROMETHAZINE-DM) 6.25-15 MG/5ML syrup Take 5 mLs by mouth every 4 hours as needed   Yes Automatic Reconciliation, Ar   tiotropium (SPIRIVA) 18 MCG inhalation capsule Inhale 1 capsule into the lungs daily   Yes Automatic Reconciliation,  Ar   traZODone (DESYREL) 100 MG tablet Take 1 tablet by mouth   Yes Automatic Reconciliation, Ar   clopidogrel (PLAVIX) 75 MG tablet Take 1 tablet by  mouth    [provider]   lidocaine (LIDODERM) 5 % Apply patch to the affected area for 12 hours a day and remove for 12 hours a day. 01/24/17   Automatic Reconciliation, Ar       Physical Exam:     General: well developed, well nourished   HEENT: unremarkable   Heart: regular rhythm no mumur    Lungs: clear   Abdominal:  benign   Neurological: unremarkable   Extremities: no edema     Findings/Diagnosis: Personal history of colonic polyps    Plan of Care/Planned Procedure: Colonoscopy with monitored anesthesia care sedation    Signed:  Laurinda Porch, MD 09/03/2023

## 2023-09-03 NOTE — Anesthesia Pre-Procedure Evaluation (Addendum)
 Department of Anesthesiology  Preprocedure Note       Name:  Claudia [name]   Age:  67 y.o.  DOB:  13-Oct-1955                                          MRN:  [id number]         Date:  09/03/2023      Surgeon: Kelby Patches):  Scot Cutter., MD    Procedure: Procedure(s):  COLONOSCOPY DIAGNOSTIC    Medications prior to admission:   Prior to Admission medications    Medication Sig Start Date End Date Taking? Authorizing Provider   albuterol (PROVENTIL) (2.5 MG/3ML) 0.083% nebulizer solution Inhale 2.5 mg into the lungs in the morning and 2.5 mg at noon and 2.5 mg in the evening and 2.5 mg before bedtime.    Automatic Reconciliation, Ar   atorvastatin (LIPITOR) 40 MG tablet Take by mouth    Automatic Reconciliation, Ar   benztropine (COGENTIN) 0.5 MG tablet Take 0.5 mg by mouth 06/25/09   Automatic Reconciliation, Ar   vitamin D (CHOLECALCIFEROL) 25 MCG (1000 UT) TABS tablet Take by mouth daily    Automatic Reconciliation, Ar   citalopram (CELEXA) 40 MG tablet Take 40 mg by mouth    Automatic Reconciliation, Ar   docusate (COLACE, DULCOLAX) 100 MG CAPS Take 100 mg by mouth 2 times daily 12/23/12   Automatic Reconciliation, Ar   ezetimibe (ZETIA) 10 MG tablet Take 5 mg by mouth daily    Automatic Reconciliation, Ar   lidocaine (LIDODERM) 5 % Apply patch to the affected area for 12 hours a day and remove for 12 hours a day. 01/24/17   Automatic Reconciliation, Ar   loratadine (CLARITIN) 10 MG tablet Take 10 mg by mouth daily    Automatic Reconciliation, Ar   nicotine polacrilex (COMMIT) 4 MG lozenge Place 4 mg inside cheek as needed    Automatic Reconciliation, Ar   OLANZapine (ZYPREXA) 15 MG tablet Take 15 mg by mouth    Automatic Reconciliation, Ar   promethazine-dextromethorphan (PROMETHAZINE-DM) 6.25-15 MG/5ML syrup Take 5 mLs by mouth every 4 hours as needed    Automatic Reconciliation, Ar   tiotropium (SPIRIVA) 18 MCG inhalation capsule Inhale 1 capsule into the lungs daily    Automatic Reconciliation, Ar    traZODone (DESYREL) 100 MG tablet Take 100 mg by mouth    Automatic Reconciliation, Ar       Current medications:    No current facility-administered medications for this encounter.     Current Outpatient Medications   Medication Sig Dispense Refill   . albuterol (PROVENTIL) (2.5 MG/3ML) 0.083% nebulizer solution Inhale 2.5 mg into the lungs in the morning and 2.5 mg at noon and 2.5 mg in the evening and 2.5 mg before bedtime.     Aaron Aas atorvastatin (LIPITOR) 40 MG tablet Take by mouth     . benztropine (COGENTIN) 0.5 MG tablet Take 0.5 mg by mouth     . vitamin D (CHOLECALCIFEROL) 25 MCG (1000 UT) TABS tablet Take by mouth daily     . citalopram (CELEXA) 40 MG tablet Take 40 mg by mouth     . docusate (COLACE, DULCOLAX) 100 MG CAPS Take 100 mg by mouth 2 times daily     . ezetimibe (ZETIA) 10 MG tablet Take 5 mg by mouth daily     .  lidocaine (LIDODERM) 5 % Apply patch to the affected area for 12 hours a day and remove for 12 hours a day.     . loratadine (CLARITIN) 10 MG tablet Take 10 mg by mouth daily     . nicotine polacrilex (COMMIT) 4 MG lozenge Place 4 mg inside cheek as needed     . OLANZapine (ZYPREXA) 15 MG tablet Take 15 mg by mouth     . promethazine-dextromethorphan (PROMETHAZINE-DM) 6.25-15 MG/5ML syrup Take 5 mLs by mouth every 4 hours as needed     . tiotropium (SPIRIVA) 18 MCG inhalation capsule Inhale 1 capsule into the lungs daily     . traZODone (DESYREL) 100 MG tablet Take 100 mg by mouth         Allergies:  No Known Allergies    Problem List:    Patient Active Problem List   Diagnosis Code   . Unstable angina (HCC) I20.0   . Colon cancer (HCC) C18.9       Past Medical History:        Diagnosis Date   . Arthritis     unknown type - back   . CAD (coronary artery disease)      stent placed in 2006 [place] ; followed by Dr Darrell Else   . Cancer (HCC)     colon ca   . Chronic obstructive pulmonary disease (HCC)     managed by PCP   . Hypercholesteremia    . Menopause    . MI (myocardial infarction) (HCC)  2006    in [place]    . Psychiatric disorder     schizophrenia   . Schizophrenia (HCC)     Dr Hessie Loss on [address]       Past Surgical History:        Procedure Laterality Date   . COLONOSCOPY N/A 08/04/2017    COLONOSCOPY performed by Scot Cutter., MD at [facility]   . COLONOSCOPY N/A 04/16/2021    COLONOSCOPY performed by Scot Cutter., MD at [facility]   . COLONOSCOPY,REMV LESN,SNARE  08/04/2017        . COLORECTAL SCRN; HI RISK IND  11/01/2013        . COLORECTAL SCRN; HI RISK IND  08/04/2017        . HEENT      teeth removal   . PR UNLISTED PROCEDURE CARDIAC SURGERY  2006    stent placed       Social History:    Social History     Tobacco Use   . Smoking status: Former     Current packs/day: 1.50     Types: Cigarettes   . Smokeless tobacco: Current   . Tobacco comments:     Quit smoking: vaping   Substance Use Topics   . Alcohol use: Not Currently                                Ready to quit: Not Answered  Counseling given: Not Answered  Tobacco comments: Quit smoking: vaping      Vital Signs (Current): There were no vitals filed for this visit.                                           BP Readings from Last 3 Encounters:  05/22/23 124/62       NPO Status:                                                                                 BMI:   Wt Readings from Last 3 Encounters:   05/22/23 81.6 kg (180 lb)   10/08/22 59 kg (130 lb)     There is no height or weight on file to calculate BMI.    CBC:   Lab Results   Component Value Date/Time    WBC 9.6 05/22/2023 03:38 PM    RBC 4.69 05/22/2023 03:38 PM    HGB 13.7 05/22/2023 03:38 PM    HCT 41.7 05/22/2023 03:38 PM    MCV 88.9 05/22/2023 03:38 PM    RDW 12.6 05/22/2023 03:38 PM    PLT 266 05/22/2023 03:38 PM       CMP:   Lab Results   Component Value Date/Time    NA 138 05/22/2023 03:38 PM    K 4.5 05/22/2023 03:38 PM    CL 103 05/22/2023 03:38 PM    CO2 32 05/22/2023 03:38 PM    BUN 17 05/22/2023 03:38 PM    CREATININE 0.84  05/22/2023 03:38 PM    GFRAA >60 12/28/2019 04:32 PM    AGRATIO 1.3 03/18/2021 08:20 AM    LABGLOM 76 05/22/2023 03:38 PM    LABGLOM >60 03/18/2021 08:20 AM    GLUCOSE 102 05/22/2023 03:38 PM    CALCIUM 9.5 05/22/2023 03:38 PM    BILITOT 0.5 05/22/2023 03:38 PM    ALKPHOS 68 05/22/2023 03:38 PM    ALKPHOS 54 03/18/2021 08:20 AM    AST 19 05/22/2023 03:38 PM    ALT 22 05/22/2023 03:38 PM       POC Tests: No results for input(s): "POCGLU", "POCNA", "POCK", "POCCL", "POCBUN", "POCHEMO", "POCHCT" in the last 72 hours.    Coags: No results found for: "PROTIME", "INR", "APTT"    HCG (If Applicable): No results found for: "PREGTESTUR", "PREGSERUM", "HCG", "HCGQUANT"     ABGs: No results found for: "PHART", "PO2ART", "PCO2ART", "HCO3ART", "BEART", "O2SATART"     Type & Screen (If Applicable):  No results found for: "ABORH", "LABANTI"    Drug/Infectious Status (If Applicable):  No results found for: "HIV", "HEPCAB"    COVID-19 Screening (If Applicable):   Lab Results   Component Value Date/Time    COVID19 Detected 03/18/2021 07:50 AM           Anesthesia Evaluation  Patient summary reviewed and Nursing notes reviewed   no history of anesthetic complications:   Airway: Mallampati: II  TM distance: >3 FB   Neck ROM: full  Mouth opening: > = 3 FB   Dental:    (+) edentulous      Pulmonary:normal exam  breath sounds clear to auscultation  (+)  COPD:                                    ROS comment: Smoking Status: Former   Cardiovascular:    (+) angina:, past MI: no interval change, CAD:, CABG/stent (Stent):, hyperlipidemia  Rhythm: regular  Rate: normal                    Neuro/Psych:   (+) psychiatric history:depression/anxiety              ROS comment: schizophrenia GI/Hepatic/Renal: Neg GI/Hepatic/Renal ROS            Endo/Other: Negative Endo/Other ROS                    Abdominal: normal exam            Vascular:          Other Findings:         Anesthesia Plan      MAC     ASA 3       Induction:  intravenous.      Anesthetic plan and risks discussed with patient.    Use of blood products discussed with patient whom consented to blood products.    Plan discussed with CRNA.    Attending anesthesiologist reviewed and agrees with Preprocedure content              Alyce Baba, MD   09/03/2023

## 2023-09-16 ENCOUNTER — Encounter

## 2023-09-23 ENCOUNTER — Encounter

## 2023-11-19 ENCOUNTER — Inpatient Hospital Stay: Admit: 2023-11-19 | Payer: Medicare (Managed Care) | Attending: Critical Care Medicine | Primary: Family Medicine

## 2023-11-19 DIAGNOSIS — Z87891 Personal history of nicotine dependence: Principal | ICD-10-CM

## 2023-11-19 DIAGNOSIS — Z1231 Encounter for screening mammogram for malignant neoplasm of breast: Principal | ICD-10-CM

## 2023-11-25 ENCOUNTER — Encounter
Admit: 2023-11-25 | Discharge: 2023-11-25 | Payer: MEDICARE | Primary: Student in an Organized Health Care Education/Training Program

## 2023-11-25 NOTE — Progress Notes
 Mikayla Ramirez, 08/08/1955 has an appointment with Dr. Albina on 12/02/22.    Please send recent lab results for continuity of care.    Thank you,   Jerine Surles    Phone: [phone]  Fax: [phone]

## 2023-11-27 ENCOUNTER — Encounter
Admit: 2023-11-27 | Discharge: 2023-11-27 | Payer: MEDICARE | Primary: Student in an Organized Health Care Education/Training Program

## 2023-12-02 ENCOUNTER — Encounter
Admit: 2023-12-02 | Discharge: 2023-12-02 | Payer: MEDICARE | Primary: Student in an Organized Health Care Education/Training Program

## 2023-12-02 ENCOUNTER — Ambulatory Visit
Admit: 2023-12-02 | Discharge: 2023-12-03 | Payer: MEDICARE | Primary: Student in an Organized Health Care Education/Training Program

## 2023-12-02 DIAGNOSIS — R002 Palpitations: Principal | ICD-10-CM

## 2023-12-02 NOTE — Telephone Encounter
 Attempted to contact patient to offer earlier appointment time today. No answer; voicemail not set up.

## 2023-12-02 NOTE — Patient Instructions
 Follow-Up:    -Thank you for allowing [place] to participate in your care today. Your After Visit Summary is being completed by Tawny Asal, RN.  -We would like you to follow up in  1 years with Myriam Jacobson, MD  -The schedule is released approximately 4-5 months in advance. You will be called by our scheduling department to make an appointment and you will also receive a notification via MyChart to self-schedule.  However, if you would like to call to make this appointment, please call [phone].     Contacting our office:    -Business Hours: Monday-Friday, 8:00 am-4:30 pm (excluding Holidays).     - How to reach [place], important numbers to keep handy   Scheduling:  [phone]   Nurse team: [phone] (monitored during business hours)   After hours triage: [phone]   Financial counselors [phone]    - You can also reach [place] through MyChart.      Results & Testing Follow Up:    -Please allow 5-7 business days for the results of any testing to be reviewed. Please call our office if you have not heard from a nurse within this time frame.    -Should you choose to complete testing at an outside facility, please contact our office after completion of testing so that we can ensure that we have received results for your provider to review.    Lab and test results:  As a part of the CARES act, starting 08/11/2019, some results will be released to you via MyChart immediately and automatically.  You may see results before your provider sees them; however, your provider will review all these results and then they, or one of their team, will notify you of result information and recommendations.   Critical results will be addressed immediately, but otherwise, please allow [place] time to get back with you prior to you reaching out to [place] for questions.  This will usually take about 72 hours for labs and 5-7 days for procedure test results.    -You may receive a survey in the upcoming weeks from [facility] [place] [facility]. Your feedback is important to [place] and helps [place] continue to improve patient care and patient satisfaction.

## 2023-12-02 NOTE — Progress Notes
 Date of Service: 12/02/2023    Mikayla Ramirez is a 68 y.o. female.       HPI     Mikayla Ramirez is a very pleasant 68 year old patient with past medical history of palpitations.  Her cardiac workup was essentially negative for any significant abnormalities.  Her recent echocardiogram on November 12, 2022 revealed a normal left ventricular function.  There was no evidence of any significant valvular abnormalities.    Since her last visit she has been doing fairly good.  Has not had any significant complaints of palpitations.  Fortunately no any syncope.    Has been diagnosed to have iron deficiency.  Has received some iron infusions.  Continues to follow-up with her primary care physician.    Blood pressures are stable.  Today's EKG shows sinus bradycardia and otherwise is within normal limits.    Thyroid functions are stable.    Recommendations:  1.  Recent echocardiogram results were discussed with the patient.  2.  Her palpitations seems to have resolved since her thyroid is under better control.  3.  Patient to follow-up with us  on an annual basis unless there is any change in symptoms.    Total time spent with Mikayla Ramirez in the clinic was 30 minutes.  This included reviewing her echocardiogram from July of last year as well as today's EKG and my recommendation and answering her questions.           Vitals:    12/02/23 1620 12/02/23 1629   BP: 122/78 130/78   BP Source: Arm, Right Upper Arm, Left Upper   Pulse: 56    PainSc: Zero    Weight: 80.4 kg (177 lb 4.8 oz)    Height: 162.6 cm (5' 4)      Body mass index is 30.43 kg/m?Mikayla Ramirez     Past Medical History  There are no active problems to display for this patient.        Review of Systems   Constitutional: Negative.   HENT: Negative.     Eyes: Negative.    Cardiovascular:  Positive for dyspnea on exertion and palpitations.   Respiratory: Negative.     Endocrine: Negative.    Hematologic/Lymphatic: Negative.    Skin: Negative.    Gastrointestinal: Negative. Genitourinary: Negative.    Neurological: Negative.    Psychiatric/Behavioral: Negative.     Allergic/Immunologic: Negative.        Physical Exam  General Appearance: resting comfortably, no acute distress  Skin: warm, moist, no ulcers or xanthomas  Digits and Nails: no clubbing  Eyes: conjunctivae and lids normal  Neck Veins: neck veins are not distended  Thyroid: no nodules, masses, tenderness or enlargement  Chest Inspection: chest is normal in appearance  Respiratory Effort: breathing is unlabored, no respiratory distress  Auscultation/Percussion: lungs clear to auscultation, no rales, rhonchi, or wheezing  PMI: PMI not enlarged or displaced  Cardiac Rhythm: regular rhythm and normal rate  Cardiac Auscultation: Normal S1 & S2, no S3 or S4, no rub  Murmurs: no cardiac murmurs   Carotid Arteries: normal carotid upstroke bilaterally, no bruits  Pedal Pulses: normal symmetric pedal pulses  Lower Extremity Edema: no lower extremity edema  Abdominal Exam: soft, non-tender, no masses, bowel sounds normal  Abdominal Aorta: nonpalpable abdominal aorta; no abdominal bruits  Liver & Spleen: no organomegaly  Neurologic Exam: neurological assessment grossly intact  Orientation: oriented to time, place and person  Affect & Mood: appropriate and sustained affect  Other: moves all  extremities        Cardiovascular Studies  March 13, 2020, 48-hour Holter monitor: Patient had two 24-hour Holter monitors.  Basic underlying rhythm was sinus.  Mean heart rate noted is 66 bpm.  Minimum heart rate was noted at 46 bpm and maximum heart rate noted was 130 bpm.  Rare atrial ectopy was noted.  There were no significant atrial arrhythmias.  Rare premature ventricular beats were noted.  There were no couplets or any ventricular tachycardia.  There were no pauses noted.  Overall 48-hour monitor revealed isolated atrial and ventricular ectopy without any complex arrhythmias.     Oct 03, 2020 echocardiogram:  Normal LV size and function. Estimated ejection fraction is 60-65 %.  Concentric remodeling is noted.  There is no resting wall motion abnormality  Normal RV size and function  The atria are normal in size  Normal valve morphology  Mild pulmonic insufficiency is seen  No pericardial effusion noted     Oct 03, 2020 stress echocardiogram:  Conclusion:   Negative ECG exercise stress test.  Decreased functional capacity for age with exaggerated heart rate response.  Negative exercise stress echo without evidence of inducible ischemia.  Overall, low risk for cardiovascular complications.    November 12, 2023 echocardiogram:    The left ventricular systolic function is normal. The ejection fraction by Simpson's biplane method is 65%. There are no segmental wall motion abnormalities. Normal left ventricular diastolic function.    The right ventricular size is normal. The right ventricular systolic function is normal.    Normal biatrial size.    No significant valvular disease.    Estimated Peak Systolic PA Pressure = 34 mmHg    No pericardial effusion.    Compared with study dated 10/03/2020, no significant change is noted.    EKG today shows sinus bradycardia.  Heart rate is 56 bpm.  EKG is within normal limits.  No significant changes compared to the EKG done on July 22, 2018.Mikayla Ramirez    Cardiovascular Health Factors  Vitals BP Readings from Last 3 Encounters:   12/02/23 130/78   11/12/22 128/82   07/22/22 122/80     Wt Readings from Last 3 Encounters:   12/02/23 80.4 kg (177 lb 4.8 oz)   11/12/22 77.6 kg (171 lb)   07/22/22 81.5 kg (179 lb 9.6 oz)     BMI Readings from Last 3 Encounters:   12/02/23 30.43 kg/m?   11/12/22 29.35 kg/m?   07/22/22 30.83 kg/m?      Smoking Social History     Tobacco Use   Smoking Status Never   Smokeless Tobacco Never      Lipid Profile No results found for: CHOL  No results found for: HDL  No results found for: LDL  No results found for: TRIG   Blood Sugar No results found for: HGBA1C  Glucose   Date Value Ref Range Status   05/23/2021 118  Final   07/04/2020 93  Final   03/30/2020 93  Final          Problems Addressed Today  Encounter Diagnoses   Name Primary?    Palpitations Yes       Assessment and Plan     1.  Dyspnea on exertion and generalized fatigue.  Patient recently noted to have iron deficiency anemia.  Symptoms seems to be related to her iron deficiency.  Echocardiogram revealed normal left ventricular function with no evidence of any significant valvular abnormalities.  2.  Palpitations.  Seems to be rare.  This seems to be better since her thyroid is under good control.  3.  Anemia workup is being continued.  This is considered to be iron deficiency anemia.  She is following her PCP.  4.  Previous history of syncope.  Fortunately no recent episodes.         Current Medications (including today's revisions)   duloxetine DR (CYMBALTA) 30 mg capsule Take one capsule by mouth daily.    magnesium oxide (MAGOX) 400 mg (241.3 mg magnesium) tablet Take one tablet by mouth daily as needed.    ondansetron (ZOFRAN ODT) 4 mg rapid dissolve tablet Dissolve one tablet by mouth every 6 hours.    potassium chloride SR (K-DUR) 20 mEq tablet Take one tablet by mouth daily as needed.    SUMAtriptan succinate (IMITREX) 50 mg tablet as Needed.    SYNTHROID 125 mcg tablet Take one tablet by mouth daily.    topiramate (TOPAMAX) 50 mg tablet Take one tablet by mouth daily.    traMADoL (ULTRAM) 50 mg tablet Take one tablet by mouth every 8 hours as needed.    vitamins, B complex tab Take one tablet by mouth every 48 hours as needed.    vitamins, multi PED w/iron (MULTIVITAMINS W-IRON) chewable tablet Chew one tablet by mouth daily.

## 2024-04-04 ENCOUNTER — Encounter
Admit: 2024-04-04 | Discharge: 2024-04-04 | Payer: MEDICARE | Primary: Student in an Organized Health Care Education/Training Program

## 2024-04-19 ENCOUNTER — Encounter: Payer: Medicare (Managed Care) | Attending: Neurology | Primary: Family Medicine

## 2024-04-26 ENCOUNTER — Ambulatory Visit: Admit: 2024-04-26 | Payer: Medicare (Managed Care) | Primary: Family Medicine

## 2024-04-26 NOTE — Patient Instructions (Addendum)
"  Recommendations:  - The abnormal movements of the mouth/tongue, somewhat hand and feet COULD be related to a tardive dyskinesia, which is a side effect of the antipsychotic medications. I would not recommend starting treatment for the movements as your symptoms are mild and it may have more side effects that can worsen underlying psychiatric symptoms.  - Referral provided for physical therapy for gait and balance training  - Discuss with your PCP about recent labs and labs I would like to obtain to work up neuropathy. This is likely caused by diabetes. However, please make sure the following labs have been done: A1c, B12, folate, TSH + T4, SPEP.   - Follow up in 6 months      "

## 2024-04-26 NOTE — Progress Notes (Signed)
 " Neurology Note -- New Visit    Patient ID:  Claudia [name]  [id number]  68 y.o.  [birth year]      Date of Consultation:  April 26, 2024    Referring Physician: Dorn Frei, DO    Reason for Consultation:  tardive dyskinesia    Subjective:     History of Present Illness:   Claudia [name] is a 68 y.o. right-handed female who presents for evaluation of tardive dyskinesia. They are accompanied by her son, Claudia [name], who provided additional history.   Medical history includes: T2DM, COPD, HLD, mixed anxiety and depression, schizophrenia, CAD    History of Present Illness  Claudia [name] has been on various antipsychotic for management of schizophrenia since her 71s, with numerous psychiatric hospitalizations. Currently, she is on Zyprexa 15mg  daily. 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 She was referred for evaluation of tardive dyskinesia. Patient is unaware of the movements most of the time. Movements involve the tongue (inside the mouth and lip licking) and mouth (puckering), as well as sometimes the hands. She is not bothered by the movements. She also does not have teeth and does not use dentures; she does not want to be refitted for them.     Her schizophrenia is well controlled on her current regimen. Denies significant depression, anxiety, hallucinations, behavioral changes, paranoia. She has been under the care of a psychiatrist at her PCP's office.     She reports imbalance necessitating the use of rollator. She has arthritis throughout and reports pain in both knees, feeling unable to extend them fully. She endorses a sensation of tingling / sharp pains in her feet, not necessarily numbness. She has fallen several times, last one a few weeks ago when her rollator rolled out of her reach and the one prior being about 1 year ago. She has diabetes and is on metformin 500 mg. Does not know her last A1c. She has COPD from smoking and uses inhalers. She quit smoking in 2020 during a  hospitalization for COVID. She occasionally experiences dizziness, particularly when bending over or standing up, and has low blood pressure at baseline. She drinks one soda per day, two cups of coffee in the morning, and bottled water  with electrolytes. She does not engage in any exercises or physical therapy.    SOCIAL HISTORY:    Diet: One soda per day, two cups of coffee in the morning, and bottled water  with electrolytes    Tobacco: Quit smoking in 2022    Coffee/Tea/Caffeine-containing Drinks: Two cups of coffee in the morning    Current pertinent medications: olanzapine 15mg  daily, benztropine 0.5mg  daily, citalopram 40mg  daily  Prior medications: other unknown antipsychotic medications    Past Medical History:   Diagnosis Date    Arthritis     unknown type - back    CAD (coronary artery disease)      stent placed in 2006 [place] ; followed by Claudia Claudia [name]    Cancer [facility])     colon ca    Chronic obstructive pulmonary disease (HCC)     managed by PCP    Hypercholesteremia     Menopause     MI (myocardial infarction) (HCC) 2006    in [place]     Psychiatric disorder     schizophrenia    Schizophrenia (HCC)     Claudia [name] on Francisca Mulligan        Past Surgical History:   Procedure Laterality Date    COLONOSCOPY N/A  08/04/2017    COLONOSCOPY performed by Claudia [name]., MD at [facility]    COLONOSCOPY N/A 04/16/2021    COLONOSCOPY performed by Claudia [name]., MD at [facility]    COLONOSCOPY N/A 09/03/2023    COLONOSCOPY DIAGNOSTIC performed by Claudia [name]., MD at [facility]    COLONOSCOPY,REMV LESN,SNARE  08/04/2017         COLORECTAL SCRN; HI RISK IND  11/01/2013         COLORECTAL SCRN; HI RISK IND  08/04/2017         HEENT      teeth removal    PR UNLISTED PROCEDURE CARDIAC SURGERY  2006    stent placed        Family History   Problem Relation Age of Onset    Cancer Brother         kidney    Breast Cancer Mother         42's    Cancer Mother         breast    Heart  Attack Father         Social History     Tobacco Use    Smoking status: Former     Current packs/day: 1.50     Types: Cigarettes    Smokeless tobacco: Current    Tobacco comments:     Quit smoking: vaping   Substance Use Topics    Alcohol use: Not Currently        No Known Allergies     Prior to Admission medications   Medication Sig Start Date End Date Taking? Authorizing Provider   aspirin 81 MG EC tablet Take 1 tablet by mouth daily   Yes [provider]   Melatonin ER 10 MG TBCR 1 tab Orally at bedtime   Yes [provider]   metFORMIN (GLUCOPHAGE) 500 MG tablet Take 1 tablet by mouth daily (with breakfast)   Yes [provider]   Ascorbic Acid (VITAMIN C) 250 MG tablet Take 1 tablet by mouth daily   Yes [provider]   clopidogrel (PLAVIX) 75 MG tablet Take 1 tablet by mouth   Yes [provider]   albuterol (PROVENTIL) (2.5 MG/3ML) 0.083% nebulizer solution Inhale 3 mLs into the lungs every 6 hours   Yes Automatic Reconciliation, Ar   atorvastatin (LIPITOR) 40 MG tablet Take by mouth   Yes Automatic Reconciliation, Ar   benztropine (COGENTIN) 0.5 MG tablet Take 1 tablet by mouth 06/25/09  Yes Automatic Reconciliation, Ar   vitamin D (CHOLECALCIFEROL) 25 MCG (1000 UT) TABS tablet Take by mouth daily   Yes Automatic Reconciliation, Ar   citalopram (CELEXA) 40 MG tablet Take 1 tablet by mouth   Yes Automatic Reconciliation, Ar   ezetimibe (ZETIA) 10 MG tablet Take 0.5 tablets by mouth daily   Yes Automatic Reconciliation, Ar   OLANZapine (ZYPREXA) 15 MG tablet Take 1 tablet by mouth   Yes Automatic Reconciliation, Ar   tiotropium (SPIRIVA) 18 MCG inhalation capsule Inhale 1 capsule into the lungs daily   Yes Automatic Reconciliation, Ar   docusate (COLACE, DULCOLAX) 100 MG CAPS Take 100 mg by mouth 2 times daily  Patient not taking: Reported on 04/26/2024 12/23/12   Automatic Reconciliation, Ar   lidocaine  (LIDODERM ) 5 % Apply patch to the affected area for 12 hours a  day and remove for 12 hours a day.  Patient not taking: Reported on 04/26/2024 01/24/17  Automatic Reconciliation, Ar   loratadine (CLARITIN) 10 MG tablet Take 1 tablet by mouth daily  Patient not taking: Reported on 04/26/2024    Automatic Reconciliation, Ar   nicotine polacrilex (COMMIT) 4 MG lozenge Place 1 lozenge inside cheek as needed  Patient not taking: Reported on 04/26/2024    Automatic Reconciliation, Ar   promethazine-dextromethorphan (PROMETHAZINE-DM) 6.25-15 MG/5ML syrup Take 5 mLs by mouth every 4 hours as needed  Patient not taking: Reported on 04/26/2024    Automatic Reconciliation, Ar   traZODone (DESYREL) 100 MG tablet Take 1 tablet by mouth  Patient not taking: Reported on 04/26/2024    Automatic Reconciliation, Ar       Review of Systems:    General, constitutional: negative  Eyes, vision: negative  Ears, nose, throat: negative  Cardiovascular, heart: negative  Respiratory: negative  Gastrointestinal: negative  Genitourinary: negative  Musculoskeletal: negative  Skin and integumentary: negative  Psychiatric: negative  Endocrine: negative  Neurological: negative, except for HPI  Hematologic/lymphatic: negative  Allergy/immunology: negative    Objective:     BP 102/60 (BP Site: Right Upper Arm, Patient Position: Sitting, BP Cuff Size: Medium Adult)   Pulse 68   Temp 97.6 F (36.4 C) (Temporal)   Resp 18   Ht 1.626 m (5' 4)   SpO2 95%   BMI 27.65 kg/m     Physical Exam:    General:  appears well nourished in no acute distress  Mouth: adentulous, lower lip hypertrophy  Lungs: breathing comfortably on room air  Lower extremity: no edema   Skin: intact    NEUROLOGIC:  Mental Status Exam: Alert and oriented to person, place, and time. Language intact. Normal comprehension, repetition, and naming.   Speech: no dysarthria or aphasia.  Cranial Nerves: PERRLA, EOMI, VFF. Facial sensation normal. Facial strength normal. Hearing grossly intact BL. Palate elevates symmetrically with tongue protruding  midline. 5/5 shoulder shrug and head turn bilaterally.  Motor Exam: normal muscle bulk. No pronator drift. No rigidity. No rest tremor or kinetic tremor. Tongue movements within the mouth that sometimes protrudes to lick the bottom lip, puckering and chewing mouth movements. Occasional subtle dyskinesias of the hands and feet, not always present.   Strength  RUE: 5/5 SA, 5/5 EF, 5/5 EE, 5/5 FA  LUE: 5/5 SA, 5/5 EF, 5/5 EE, 5/5 FA  RLE: 5/5 HF, 5/5 KF, 5/5 KE, 5/5 PF, 5/5 DF  LLE: 5/5 HF, 5/5 KF, 5/5 KE, 5/5 PF, 5/5 DF  Sensory: Intact light touch, temperature. Reduced vibration in both legs, best at knees ~8 sec.   Coordination: No ataxia on FTN   Muscle Stretch Reflexes: brisk but symmetric in UE's and LE's with Hoffman + and cross adductor +, plantar response flexor bilaterally  Gait: needs assistance to stand. Widened base. Quite unstable, needs assistance or walker to ambulate    Labs:     Lab Results   Component Value Date/Time    NA 138 05/22/2023 03:38 PM    K 4.5 05/22/2023 03:38 PM    CL 103 05/22/2023 03:38 PM    BUN 17 05/22/2023 03:38 PM    WBC 9.6 05/22/2023 03:38 PM    HCT 41.7 05/22/2023 03:38 PM    HGB 13.7 05/22/2023 03:38 PM    PLT 266 05/22/2023 03:38 PM       Imaging:   No results found.     Patient Active Problem List   Diagnosis    Unstable angina (HCC)    Colon cancer (HCC)  Imbalance    Abnormal involuntary movements    Peripheral polyneuropathy        ASSESSMENT & PLAN:  68 y.o. right-handed female who presents for evaluation of tardive dyskinesia.   Assessment & Plan  #Abnormal involuntary movements  Patient has involuntary movements of the tongue and mouth, as well as hands and feet. On exam, she has tongue movements within the mouth that sometimes protrudes to lick the bottom lip, puckering and chewing mouth movements, occasional slow movements of the hands and feet (not always present). She is adentulous and does not want dentures. She has been on multiple antipsychotic medications in  the past for management of schizophrenia, currently on Zyprexa 15mg  daily with good control.   We discussed that the involuntary movements may be a sign of tardive dyskinesia but may also be exacerbated by lack of teeth. Given mild and non-bothersome nature of the movements, would not recommend treatment of TD with VMAT2 inhibitors at this time. We discussed potential side effects with VMAT2 inhibitors including parkinsonism, akathisia, sedation, and worsening underlying psychiatric disorders like depression. We will hold off on meds for TD for now.   She is on Cogentin likely for parkinsonism. There are subtle signs of slowness and more noticeable gait changes, but no clear rigidity. Continue this for now.     #Imbalance  #Peripheral neuropathy  She has imbalance necessitating use of walker. Exam with widened base and slow unsteady gait requiring assistance. Exam also shows a length dependent large fiber neuropathy. This may be due to underlying diabetes. Other considerations include vitamin deficiency or PVD. She had labs done with her PCP recently. They will check with their PCP for the following labs to be done (if not already): A1c, B12, folate, TSH + T4, SPEP.   A referral to PT was provided for gait and balance training.      Follow-up  The patient will follow up in 6 months.      I spent 55 minutes preparing the office visit by reviewing medical records, obtaining a history, performing examination, counseling and educating the patient on diagnosis, documenting in the clinical medical record, and coordinating the care for the patient. The patient had the ability to ask questions and all questions were answered.      The patient (or guardian, if applicable) and other individuals in attendance with the patient were advised that Artificial Intelligence will be utilized during this visit to record, process the conversation to generate a clinical note, and support improvement of the AI technology. The patient (or  guardian, if applicable) and other individuals in attendance at the appointment consented to the use of AI, including the recording.      Signed By:  Oakley Broderick Rued, MD     April 26, 2024         "
